# Patient Record
Sex: Female | Born: 1985 | Race: Black or African American | Hispanic: No | Marital: Single
Health system: Midwestern US, Community
[De-identification: ages and names within clinical notes are randomized; demographics above are authoritative.]

## PROBLEM LIST (undated history)

## (undated) DIAGNOSIS — L309 Dermatitis, unspecified: Secondary | ICD-10-CM

## (undated) DIAGNOSIS — K219 Gastro-esophageal reflux disease without esophagitis: Secondary | ICD-10-CM

## (undated) DIAGNOSIS — G43909 Migraine, unspecified, not intractable, without status migrainosus: Secondary | ICD-10-CM

## (undated) DIAGNOSIS — F411 Generalized anxiety disorder: Secondary | ICD-10-CM

## (undated) DIAGNOSIS — F419 Anxiety disorder, unspecified: Secondary | ICD-10-CM

## (undated) DIAGNOSIS — Z973 Presence of spectacles and contact lenses: Secondary | ICD-10-CM

## (undated) DIAGNOSIS — N941 Unspecified dyspareunia: Secondary | ICD-10-CM

## (undated) DIAGNOSIS — N83209 Unspecified ovarian cyst, unspecified side: Secondary | ICD-10-CM

## (undated) DIAGNOSIS — Z8619 Personal history of other infectious and parasitic diseases: Secondary | ICD-10-CM

## (undated) DIAGNOSIS — M1990 Unspecified osteoarthritis, unspecified site: Secondary | ICD-10-CM

## (undated) DIAGNOSIS — N930 Postcoital and contact bleeding: Secondary | ICD-10-CM

## (undated) HISTORY — DX: Dermatitis, unspecified: L30.9

## (undated) HISTORY — DX: Migraine, unspecified, not intractable, without status migrainosus: G43.909

## (undated) HISTORY — DX: Unspecified osteoarthritis, unspecified site: M19.90

## (undated) HISTORY — DX: Unspecified ovarian cyst, unspecified side: N83.209

## (undated) HISTORY — DX: Gastro-esophageal reflux disease without esophagitis: K21.9

## (undated) HISTORY — PX: HYSTEROSCOPY WITH D & C: SHX1775

## (undated) HISTORY — DX: Personal history of other infectious and parasitic diseases: Z86.19

## (undated) HISTORY — DX: Anxiety disorder, unspecified: F41.9

---
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
## 2006-06-14 HISTORY — PX: CERVICAL POLYPECTOMY: SHX88

## 2007-08-29 ENCOUNTER — Ambulatory Visit (HOSPITAL_COMMUNITY): Admission: RE | Admit: 2007-08-29 | Discharge: 2007-08-29 | Payer: Self-pay | Admitting: Obstetrics and Gynecology

## 2007-08-29 ENCOUNTER — Encounter (INDEPENDENT_AMBULATORY_CARE_PROVIDER_SITE_OTHER): Payer: Self-pay | Admitting: Obstetrics and Gynecology

## 2010-06-28 LAB — URINALYSIS, DIRECTED
Bilirubin, Urine: NEGATIVE
Blood, Urine: NEGATIVE
Glucose, UA: NEGATIVE
Ketones, Urine: NEGATIVE
Leukocyte Esterase, Urine: NEGATIVE
Nitrite, Urine: NEGATIVE
Protein, UA: NEGATIVE
Specific Gravity, UA: 1.02 (ref 1.015–1.025)
Urobilinogen, Urine: 1
pH, UA: 7

## 2010-06-28 LAB — WET PREP, GENITAL
TRICHOMONAS VAGINALIS SCREEN: NONE SEEN
Yeast, UA: NONE SEEN

## 2010-06-29 LAB — C. TRACHOMATIS / N. GONORRHOEAE, DNA
C. trachomatis DNA: NEGATIVE
GC DNA: NEGATIVE

## 2010-08-30 LAB — URINALYSIS, DIRECTED
Bilirubin, Urine: NEGATIVE
Blood, Urine: NEGATIVE
Glucose, UA: NEGATIVE
Nitrite, Urine: NEGATIVE
Specific Gravity, UA: 1.029 — ABNORMAL HIGH (ref 1.015–1.025)
Urobilinogen, Urine: 1
pH, UA: 6.5

## 2010-08-30 LAB — COMPREHENSIVE METABOLIC PANEL
ALT: 21 U/L (ref 0–63)
AST: 24 U/L (ref 0–35)
Albumin: 5 g/dL (ref 3.5–5.2)
Alk Phosphatase: 67 U/L (ref 40–135)
Anion Gap: 7 mEq/L (ref 7–13)
BUN: 13 mg/dL (ref 10–26)
CO2: 28 mEq/L (ref 22–32)
Calcium: 9.5 mg/dL (ref 8.5–10.2)
Chloride: 105 mEq/L (ref 99–111)
Creatinine: 0.94 mg/dL (ref 0.50–1.10)
GFR African American: 88.6
GFR Non-African American: 73.2
Globulin: 3.3 g/dL (ref 2.3–3.5)
Glucose: 72 mg/dL (ref 60–115)
Potassium: 4 mEq/L (ref 3.5–5.5)
Sodium: 140 mEq/L (ref 135–146)
Total Bilirubin: 0.8 mg/dL (ref 0.2–1.1)
Total Protein: 8.3 g/dL — ABNORMAL HIGH (ref 6.4–8.1)

## 2010-08-30 LAB — MICROSCOPIC URINALYSIS

## 2010-08-30 LAB — CBC WITH DIFFERENTIAL
Basophils %: 0 % (ref 0.0–2.0)
Basophils Absolute: 0 10*3/uL (ref 0.0–0.2)
Eosinophils %: 1.4 % (ref 0.0–4.0)
Eosinophils Absolute: 0.1 10*3/uL (ref 0.0–0.7)
Granulocyte Absolute Count: 5.7 10*3/uL (ref 1.4–6.5)
Granulocytes, Relative Percent: 82.8 % — ABNORMAL HIGH (ref 42.2–75.2)
HCT: 47.4 % — ABNORMAL HIGH (ref 37.0–47.0)
Hemoglobin: 15.9 g/dL (ref 12.0–16.0)
Lymphocytes %: 7.7 % — ABNORMAL LOW (ref 20.5–51.1)
Lymphocytes Absolute: 0.5 10*3/uL — ABNORMAL LOW (ref 1.0–4.8)
MCH: 32.9 pg — ABNORMAL HIGH (ref 27.0–31.0)
MCHC: 33.5 % (ref 33.0–37.0)
MCV: 98.4 fL (ref 82.0–100.0)
MPV: 10 fL (ref 7.4–10.4)
Monocytes %: 8.1 % (ref 2.0–11.0)
Monocytes Absolute: 0.6 10*3/uL (ref 0.2–0.8)
Platelets: 210 10*3/uL (ref 130–400)
RBC: 4.82 10*6/uL (ref 4.20–5.40)
RDW: 12.6 % (ref 11.5–14.5)
WBC: 6.9 10*3/uL (ref 4.8–10.8)

## 2010-08-30 LAB — LIPASE: Lipase: 23 U/L (ref 7–59)

## 2010-08-30 LAB — AMYLASE: Amylase: 66 U/L (ref 25–118)

## 2010-08-30 LAB — PREGNANCY, URINE: Pregnancy, Urine: NEGATIVE

## 2010-10-27 NOTE — Op Note (Signed)
Stacy Hawkins, Stacy Hawkins         ACCOUNT NO.:  [id number]   MEDICAL RECORD NO.:  [id number]          PATIENT TYPE:  AMB   LOCATION:  SDC                           FACILITY:  [facility]   PHYSICIAN:  Osborn Coho, M.D.   DATE OF BIRTH:  1985/11/22   DATE OF PROCEDURE:  08/29/2007  DATE OF DISCHARGE:                               OPERATIVE REPORT   PREOPERATIVE DIAGNOSIS:  Metrorrhagia.   POSTOPERATIVE DIAGNOSIS:  Metrorrhagia.   PROCEDURE:  1. Hysteroscopy.  2. D&C.   SURGEON:  Dr. Osborn Coho.   ANESTHESIA:  General via LMA.   SPECIMENS TO PATHOLOGY:  Endometrial curettings.   FLUIDS:  1200 mL   URINE OUTPUT:  Approximately 500 mL via straight cath prior to  procedure.   ESTIMATED BLOOD LOSS:  Minimal.   COMPLICATIONS:  None.   HYSTEROSCOPIC FLUID DEFICIT:  50 mL of sorbitol   PROCEDURE:  The patient was taken to the operating room after the risks,  benefits and alternatives were discussed with the patient.  The patient  verbalized understanding and consent signed and witnessed.  The patient  was placed under general per anesthesia and prepped and draped in the  normal sterile fashion in the dorsal lithotomy position.  A bivalve  speculum was placed in the patient's vagina and the anterior lip of the  cervix grasped with a single-tooth tenaculum.  A paracervical block was  administered using a total of 10 mL of 1% lidocaine.  The cervix was  dilated for passage of the hysteroscope and the uterus sounded to  approximately 8 cm.  The hysteroscope was introduced and no obvious  intracavitary lesions were noted.  Curettage was performed until a  gritty texture was noted.  The  hysteroscope was introduced once again and again no intracavitary  lesions noted.  All instruments were removed.  There was good hemostasis  at the tenaculum site.  The patient tolerated the procedure well.  The  count was correct.  The patient was awaiting return to the recovery room  in good  condition.      Osborn Coho, M.D.  Electronically Signed     AR/MEDQ  D:  08/29/2007  T:  08/29/2007  Job:  [id number]

## 2011-01-29 LAB — CBC WITH DIFFERENTIAL
Basophils %: 0.5 % (ref 0.0–2.0)
Basophils Absolute: 0 10*3/uL (ref 0.0–0.2)
Eosinophils %: 9.1 % — ABNORMAL HIGH (ref 0.0–4.0)
Eosinophils Absolute: 0.4 10*3/uL (ref 0.0–0.7)
Granulocyte Absolute Count: 1.9 10*3/uL (ref 1.4–6.5)
Granulocytes, Relative Percent: 46.4 % (ref 42.2–75.2)
Hematocrit: 38.3 % (ref 37.0–47.0)
Hemoglobin: 13.3 g/dL (ref 12.0–16.0)
Lymphocytes %: 32.1 % (ref 20.5–51.1)
Lymphocytes Absolute: 1.3 10*3/uL (ref 1.0–4.8)
MCH: 33.4 pg — ABNORMAL HIGH (ref 27.0–31.0)
MCHC: 34.8 % (ref 33.0–37.0)
MCV: 95.9 fL (ref 82.0–100.0)
MPV: 9.8 fL (ref 7.4–10.4)
Monocytes %: 11.9 % — ABNORMAL HIGH (ref 2.0–11.0)
Monocytes Absolute: 0.5 10*3/uL (ref 0.2–0.8)
Platelets: 193 10*3/uL (ref 130–400)
RBC: 3.99 10*6/uL — ABNORMAL LOW (ref 4.20–5.40)
RDW: 12.2 % (ref 11.5–14.5)
WBC: 4.1 10*3/uL — ABNORMAL LOW (ref 4.8–10.8)

## 2011-01-29 LAB — COMPREHENSIVE METABOLIC PANEL
ALT: 21 U/L (ref 0–63)
AST: 24 U/L (ref 0–35)
Albumin: 4.3 g/dL (ref 3.5–5.2)
Alk Phosphatase: 78 U/L (ref 40–135)
Anion Gap: 5 mEq/L — ABNORMAL LOW (ref 7–13)
BUN: 8 mg/dL — ABNORMAL LOW (ref 10–26)
CO2: 29 mEq/L (ref 22–32)
Calcium: 8.8 mg/dL (ref 8.5–10.2)
Chloride: 107 mEq/L (ref 99–111)
Creatinine: 0.78 mg/dL (ref 0.50–1.10)
GFR African American: 109.5
GFR Non-African American: 90.5
Globulin: 2.6 g/dL (ref 2.3–3.5)
Glucose: 85 mg/dL (ref 60–115)
Potassium: 4 mEq/L (ref 3.5–5.5)
Sodium: 141 mEq/L (ref 135–146)
Total Bilirubin: 0.5 mg/dL (ref 0.2–1.1)
Total Protein: 6.9 g/dL (ref 6.4–8.1)

## 2011-03-08 LAB — CBC
HCT: 42.2
Hemoglobin: 14.9
RDW: 12.3
WBC: 6.9

## 2012-05-13 LAB — CBC WITH DIFFERENTIAL
Basophils %: 0.7 %
Basophils Absolute: 0.1 10*3/uL (ref 0.0–0.2)
Eosinophils %: 3.9 %
Eosinophils Absolute: 0.3 10*3/uL (ref 0.0–0.7)
Hematocrit: 38.8 % (ref 37.0–47.0)
Hemoglobin: 13.4 g/dL (ref 12.0–16.0)
Lymphocytes %: 25.8 %
Lymphocytes Absolute: 2 10*3/uL (ref 1.0–4.8)
MCH: 33.4 pg — ABNORMAL HIGH (ref 27.0–31.3)
MCHC: 34.4 % (ref 33.0–37.0)
MCV: 97 fL (ref 82.0–100.0)
MPV: 10.2 fL (ref 7.4–10.4)
Monocytes %: 8.9 %
Monocytes Absolute: 0.7 10*3/uL (ref 0.2–0.8)
Neutrophils %: 60.7 %
Neutrophils Absolute: 4.6 10*3/uL (ref 1.4–6.5)
Platelets: 202 10*3/uL (ref 130–400)
RBC: 4 M/uL — ABNORMAL LOW (ref 4.20–5.40)
RDW: 12.6 % (ref 11.5–14.5)
WBC: 7.7 10*3/uL (ref 4.8–10.8)

## 2012-05-13 LAB — COMPREHENSIVE METABOLIC PANEL
ALT: 19 U/L (ref 0–63)
AST: 26 U/L (ref 0–35)
Albumin: 4.2 g/dL (ref 3.5–5.2)
Alkaline Phosphatase: 73 U/L (ref 40–135)
Anion Gap: 8 mEq/L (ref 7–13)
BUN: 16 mg/dL (ref 10–26)
CO2: 27 mEq/L (ref 22–32)
Calcium: 9.4 mg/dL (ref 8.5–10.2)
Chloride: 110 mEq/L (ref 99–111)
Creatinine: 0.88 mg/dL (ref 0.50–1.10)
GFR African American: >60 (ref >60)
GFR Non-African American: >60 (ref >60)
Globulin: 2.6 g/dL (ref 2.3–3.5)
Glucose: 71 mg/dL (ref 60–115)
Potassium: 4.7 mEq/L (ref 3.5–5.5)
Sodium: 145 mEq/L (ref 135–146)
Total Bilirubin: 0.4 mg/dL (ref 0.2–1.1)
Total Protein: 6.8 g/dL (ref 6.4–8.1)

## 2012-05-13 LAB — URINALYSIS, DIRECTED
Bilirubin Urine: NEGATIVE
Glucose, Ur: NEGATIVE mg/dL
Ketones, Urine: NEGATIVE mg/dL
Nitrite, Urine: NEGATIVE
Protein, UA: 30 mg/dL — AB
Specific Gravity, UA: 1.021 (ref 1.005–1.030)
Urobilinogen, Urine: 1 E.U./dL (ref <2.0)
pH, UA: 6 (ref 5.0–9.0)

## 2012-05-13 LAB — LIPASE: Lipase: 32 U/L (ref 7–59)

## 2012-05-13 LAB — URINALYSIS WITH MICROSCOPIC

## 2012-05-13 LAB — AMYLASE: Amylase: 161 U/L — ABNORMAL HIGH (ref 25–118)

## 2014-04-17 ENCOUNTER — Inpatient Hospital Stay: Admit: 2014-04-17 | Discharge: 2014-04-17 | Disposition: A | Discharge status: Home / Self Care

## 2014-04-17 LAB — URINALYSIS, DIRECTED
Bilirubin Urine: NEGATIVE
Glucose, Ur: NEGATIVE mg/dL
Nitrite, Urine: NEGATIVE
Protein, UA: 30 mg/dL — AB
Specific Gravity, UA: 1.029 (ref 1.005–1.030)
Urobilinogen, Urine: 1 E.U./dL (ref <2.0)
pH, UA: 6.5 (ref 5.0–9.0)

## 2014-04-17 LAB — URINALYSIS WITH MICROSCOPIC

## 2014-04-17 LAB — COMPREHENSIVE METABOLIC PANEL
ALT: 31 U/L (ref 0–33)
AST: 22 U/L (ref 0–35)
Albumin: 4.3 g/dL (ref 3.9–4.9)
Alkaline Phosphatase: 73 U/L (ref 40–130)
Anion Gap: 11 mEq/L (ref 7–13)
BUN: 11 mg/dL (ref 6–20)
CO2: 24 mEq/L (ref 22–29)
Calcium: 9 mg/dL (ref 8.6–10.2)
Chloride: 102 mEq/L (ref 98–107)
Creatinine: 0.69 mg/dL (ref 0.50–0.90)
GFR African American: >60 (ref >60)
GFR Non-African American: >60 (ref >60)
Globulin: 2.6 g/dL (ref 2.3–3.5)
Glucose: 81 mg/dL (ref 74–109)
Potassium: 3.7 mEq/L (ref 3.5–5.1)
Sodium: 137 mEq/L (ref 132–144)
Total Bilirubin: 0.4 mg/dL (ref 0.0–1.2)
Total Protein: 6.9 g/dL (ref 6.4–8.1)

## 2014-04-17 LAB — CBC WITH DIFFERENTIAL
Basophils %: 0.6 %
Basophils Absolute: 0 10*3/uL (ref 0.0–0.2)
Eosinophils %: 3.7 %
Eosinophils Absolute: 0.2 10*3/uL (ref 0.0–0.7)
Hematocrit: 35.8 % — ABNORMAL LOW (ref 37.0–47.0)
Hemoglobin: 11.9 g/dL — ABNORMAL LOW (ref 12.0–16.0)
Lymphocytes %: 35.4 %
Lymphocytes Absolute: 1.7 10*3/uL (ref 1.0–4.8)
MCH: 32.7 pg — ABNORMAL HIGH (ref 27.0–31.3)
MCHC: 33.3 % (ref 33.0–37.0)
MCV: 98 fL (ref 82.0–100.0)
Monocytes %: 12.2 %
Monocytes Absolute: 0.6 10*3/uL (ref 0.2–0.8)
Neutrophils %: 48.1 %
Neutrophils Absolute: 2.4 10*3/uL (ref 1.4–6.5)
Platelets: 198 10*3/uL (ref 130–400)
RBC: 3.65 M/uL — ABNORMAL LOW (ref 4.20–5.40)
RDW: 12.2 % (ref 11.5–14.5)
WBC: 4.9 10*3/uL (ref 4.8–10.8)

## 2014-04-17 LAB — PREGNANCY, URINE: HCG(Urine) Pregnancy Test: NEGATIVE

## 2014-04-17 LAB — LIPASE: Lipase: 19 U/L (ref 13–60)

## 2014-04-17 MED ORDER — CEFTRIAXONE SODIUM IN DEXTROSE 20 MG/ML IV SOLN
20 MG/ML | INTRAVENOUS | Status: AC
Start: 2014-04-17 — End: ?

## 2014-04-17 MED ORDER — KETOROLAC TROMETHAMINE 30 MG/ML IJ SOLN
30 MG/ML | INTRAMUSCULAR | Status: AC
Start: 2014-04-17 — End: ?

## 2014-04-17 MED ORDER — SODIUM CHLORIDE 0.9 % IV SOLN
0.9 % | INTRAVENOUS | Status: AC
Start: 2014-04-17 — End: ?

## 2014-04-17 MED FILL — SODIUM CHLORIDE 0.9 % IV SOLN: 0.9 % | INTRAVENOUS | Qty: 1000

## 2014-04-17 MED FILL — CEFTRIAXONE SODIUM IN DEXTROSE 20 MG/ML IV SOLN: 20 MG/ML | INTRAVENOUS | Qty: 50

## 2014-04-17 MED FILL — KETOROLAC TROMETHAMINE 30 MG/ML IJ SOLN: 30 MG/ML | INTRAMUSCULAR | Qty: 1

## 2015-05-17 ENCOUNTER — Emergency Department: Admit: 2015-05-17 | Discharge: 2015-05-17 | Payer: PRIVATE HEALTH INSURANCE | Primary: Internal Medicine

## 2015-05-17 ENCOUNTER — Inpatient Hospital Stay
Admit: 2015-05-17 | Discharge: 2015-05-17 | Disposition: A | Discharge status: Home / Self Care | Payer: PRIVATE HEALTH INSURANCE

## 2015-05-17 DIAGNOSIS — S0083XA Contusion of other part of head, initial encounter: Secondary | ICD-10-CM

## 2015-05-17 LAB — POCT URINE PREGNANCY: Preg Test, Ur: NEGATIVE

## 2015-05-17 MED ORDER — TRAMADOL HCL 50 MG PO TABS
50 MG | ORAL_TABLET | Freq: Four times a day (QID) | ORAL | 0 refills | Status: AC | PRN
Start: 2015-05-17 — End: ?

## 2015-05-17 MED ORDER — IBUPROFEN 800 MG PO TABS
800 MG | Freq: Once | ORAL | Status: AC
Start: 2015-05-17 — End: 2015-05-17
  Administered 2015-05-17: 08:00:00 800 mg via ORAL

## 2015-05-17 MED FILL — IBUPROFEN 800 MG PO TABS: 800 MG | ORAL | Qty: 1

## 2015-05-17 NOTE — ED Triage Notes (Signed)
Pt reports she fell down her outside steps around 0130 and hit her jaw on the cement step, she feels like the right side of her jaw might be slightly dislocated, states when she closes her mouth her teeth dont go together on the right side. She rates the pain 4/10 but said it is worse when she opens her mouth. She denies loc, no vomiting.

## 2015-05-17 NOTE — ED Provider Notes (Signed)
[facility] ED  eMERGENCY dEPARTMENT eNCOUnter      Pt Name: Leslie Vazquez  MRN: [id number]  Birthdate [birth year]  Date of evaluation: 05/17/2015  Provider: Merry Lofty, PA      HISTORY OF PRESENT ILLNESS    Leslie Vazquez is a 29 y.o. female presents to the emergency department with right jaw pain.  Patient tripped over her feet around 1:30 AM and fell down 2-3 cement steps hitting the right side of her jaw.  Patient denies loss of consciousness, denies head injuries, or injuries in another location.  When she bites down she feels the left side of her teeth touch, she does not feel her teeth on the right side touch however.  She denies visual disturbances, dizziness, lightheadedness, chest pain, shortness of breath, nausea, vomiting.  Patient has a right frontal headache and right cheek pain as well.     HPI    Nursing Notes were reviewed.    REVIEW OF SYSTEMS       Review of Systems   Constitutional: Negative for activity change, appetite change, chills and fever.   HENT: Negative for drooling, ear discharge, facial swelling, hearing loss, sore throat, trouble swallowing and voice change.         Right jaw pain     Eyes: Negative for redness.   Respiratory: Negative for cough and shortness of breath.    Cardiovascular: Negative for chest pain.   Gastrointestinal: Negative for abdominal pain, anal bleeding, blood in stool, diarrhea, nausea and vomiting.   Genitourinary: Negative for difficulty urinating.   Musculoskeletal: Negative for gait problem, neck pain and neck stiffness.   Skin: Negative for color change and rash.   Neurological: Positive for headaches. Negative for dizziness, syncope, facial asymmetry, speech difficulty, light-headedness and numbness.             PAST MEDICAL HISTORY     Past Medical History   Diagnosis Date   ??? Asthma          SURGICAL HISTORY     History reviewed. No pertinent past surgical history.      CURRENT MEDICATIONS       Previous Medications    No medications on  file       ALLERGIES     Review of patient's allergies indicates no known allergies.    FAMILY HISTORY     History reviewed. No pertinent family history.       SOCIAL HISTORY       Social History     Social History   ??? Marital status: Single     Spouse name: N/A   ??? Number of children: N/A   ??? Years of education: N/A     Social History Main Topics   ??? Smoking status: Never Smoker   ??? Smokeless tobacco: None   ??? Alcohol use Yes      Comment: occasionally   ??? Drug use: No   ??? Sexual activity: Not Asked     Other Topics Concern   ??? None     Social History Narrative   ??? None         PHYSICAL EXAM       ED Triage Vitals   BP Temp Temp Source Pulse Resp SpO2 Height Weight   05/17/15 0254 05/17/15 0254 05/17/15 0254 05/17/15 0254 05/17/15 0254 05/17/15 0254 05/17/15 0254 05/17/15 0254   117/75 98.6 ??F (37 ??C) Oral 100 16 99 % 5\' 5"  (1.651 m) 175  lb (79.4 kg)       Physical Exam   Constitutional: She is oriented to person, place, and time. She appears well-developed and well-nourished.   HENT:   Head: Normocephalic and atraumatic.       Right Ear: External ear normal.   Left Ear: External ear normal.   Mouth/Throat: Oropharynx is clear and moist.   No raccoon eyes, no battle sign. Minimal TTP in right side of jaw.  No obvious malocclusion.  Patient able to break tongue blades bilaterally with teeth   Eyes: Conjunctivae and EOM are normal. Pupils are equal, round, and reactive to light.   Neck: Normal range of motion. Neck supple. No tracheal deviation present.   Cardiovascular: Normal heart sounds and intact distal pulses.    Pulmonary/Chest: Effort normal and breath sounds normal. No stridor. No respiratory distress.   Abdominal: Soft. Bowel sounds are normal. She exhibits no distension and no mass. There is no tenderness. There is no rebound and no guarding.   Musculoskeletal: Normal range of motion.   Neurological: She is alert and oriented to person, place, and time. She has normal reflexes.   Skin: Skin is warm and  dry. No rash noted.   Psychiatric: She has a normal mood and affect. Her behavior is normal. Judgment and thought content normal.       DIAGNOSTIC RESULTS     EKG: All EKG's are interpreted by the Emergency Department Physician who either signs or Co-signs this chart in the absence of a cardiologist.        RADIOLOGY:   Non-plain film images such as CT, Ultrasound and MRI are read by the radiologist. Plain radiographic images are visualized and preliminarily interpreted by the emergency physician with the below findings:    Interpretation per the Radiologist below, if available at the time of this note:      CT FACIAL BONES WO CONTRAST    (Results Pending)   CT= no fracture        LABS:  Labs Reviewed   POCT URINE PREGNANCY - Normal       All other labs were within normal range or not returned as of this dictation.    EMERGENCY DEPARTMENT COURSE and DIFFERENTIAL DIAGNOSIS/MDM:   Vitals:    Vitals:    05/17/15 0254   BP: 117/75   Pulse: 100   Resp: 16   Temp: 98.6 ??F (37 ??C)   TempSrc: Oral   SpO2: 99%   Weight: 175 lb (79.4 kg)   Height:  (1.651 m)         MDM    Pt is laying comfortably in bed.  States Motrin did little for her pain.  She does not want anything stronger.  I informed her I we'll send her home with a couple tramadol for home and she can take them if she would like.  I also informed she can take Motrin and Tylenol for the pain.  I informed to ice the jaw when she gets home and for the first 2 days.  She is to return to the ER if anything gets worse. Standard anticipatory guidance given to patient upon discharge. Have given them a specific time frame in which to follow-up and who to follow-up with. I have also advised them that they should return to the emergency department if they get worse, or not getting better or develop any new or concerning symptoms. Patient demonstrates understanding.        CRITICAL CARE  TIME   Total Critical Care time was 0 minutes, excluding separately reportable  procedures.  There was a high probability of clinically significant/life threatening deterioration in the patient's condition which required my urgent intervention.         Procedures    FINAL IMPRESSION      1. Contusion of jaw, initial encounter          DISPOSITION/PLAN   DISPOSITION Decision to Discharge    PATIENT REFERRED TO:  Emogene MorganWilliam S Richardson, MD  [address]  [address]  [place] [id number]  [phone]    In 2 days  As needed      DISCHARGE MEDICATIONS:  New Prescriptions    TRAMADOL (ULTRAM) 50 MG TABLET    Take 1 tablet by mouth every 6 hours as needed for Pain     Attestation: The Prescription Monitoring Report for this patient was reviewed today. Cala Bradford(Markelle Najarian ChildressFinch, [place])    (Please note that portions of this note were completed with a voice recognition program.  Efforts were made to edit the dictations but occasionally words are mis-transcribed.)    Merry LoftyKIMBERLY FINCH, PA (electronically signed)  Emergency Physician Assistant         Merry LoftyKimberly Finch, [place]  05/17/15 [phone]

## 2016-07-21 ENCOUNTER — Emergency Department: Admit: 2016-07-22 | Payer: PRIVATE HEALTH INSURANCE | Primary: Internal Medicine

## 2016-07-21 DIAGNOSIS — S99921A Unspecified injury of right foot, initial encounter: Secondary | ICD-10-CM

## 2016-07-21 NOTE — ED Notes (Signed)
A Gosseaux NP at bedside     Kerby Less, RN  07/21/16 [phone]

## 2016-07-21 NOTE — ED Notes (Signed)
Pt refusing to wear post op shoe home, requests to wear normal tennis shoe. Pt given post op shoe to take home     [facility] [place] [place]  07/21/16 [phone]

## 2016-07-21 NOTE — ED Triage Notes (Signed)
Patient hit her right foot on a rocking chair today and is having pain.  Patient ambulated to triage. MSP's intact

## 2016-07-21 NOTE — ED Provider Notes (Signed)
[facility] [place] ED  eMERGENCY dEPARTMENT eNCOUnter      Pt Name: Leslie Vazquez  MRN: [id number]  Birthdate August 07, 1985  Date of evaluation: 07/21/2016  Provider: Durene Cal, NP     CHIEF COMPLAINT       Chief Complaint   Patient presents with   . Foot Injury     hit right foot on a rocking chair.  patient ambulated to triage       HISTORY OF PRESENT ILLNESS   (Location/Symptom, Timing/Onset, Context/Setting, Quality, Duration, Modifying Factors, Severity) Note limiting factors.   HPI    Leslie Vazquez is a 31 year old female patient per chart review with a history of asthma. She presents with complaints of right foot pain after hitting the top of the right foot twice on a rocking chair today. The patient states that the pain is constant and throbbing and it hurts more if the top of the foot is touched. She denies any associated symptoms. She denies any difficulty with ambulation.       Nursing Notes were reviewed.    REVIEW OF SYSTEMS    (2+ for level 4; 10+ for level 5)     Review of Systems   Constitutional: Negative for appetite change and fever.   HENT: Negative for drooling, ear pain, sore throat, trouble swallowing and voice change.    Respiratory: Negative for cough and shortness of breath.    Cardiovascular: Negative for chest pain.   Gastrointestinal: Negative for abdominal pain, constipation, diarrhea, nausea and vomiting.   Genitourinary: Negative for decreased urine volume and dysuria.   Musculoskeletal: Negative for arthralgias, back pain, gait problem and joint swelling.   Skin: Negative for color change.   Neurological: Negative for dizziness, weakness, light-headedness and headaches.   Psychiatric/Behavioral: Negative for agitation and behavioral problems.       Except as noted above the remainder of the review of systems was reviewed and negative.     PAST MEDICAL HISTORY     Past Medical History:   Diagnosis Date   . Asthma        SURGICAL HISTORY     No past surgical history on  file.    CURRENT MEDICATIONS       Discharge Medication List as of 07/21/2016 11:26 PM      CONTINUE these medications which have NOT CHANGED    Details   traMADol (ULTRAM) 50 MG tablet Take 1 tablet by mouth every 6 hours as needed for Pain, Disp-5 tablet, R-0             ALLERGIES     Review of patient's allergies indicates no known allergies.    FAMILY HISTORY     No family history on file.       SOCIAL HISTORY       Social History     Social History   . Marital status: Single     Spouse name: N/A   . Number of children: N/A   . Years of education: N/A     Social History Main Topics   . Smoking status: Never Smoker   . Smokeless tobacco: Not on file   . Alcohol use Yes      Comment: occasionally   . Drug use: No   . Sexual activity: Yes     Partners: Male     Other Topics Concern   . Not on file     Social History Narrative   . No  narrative on file       SCREENINGS           PHYSICAL EXAM    (up to 7 for level 4, 8 or more for level 5)     ED Triage Vitals   BP Temp Temp Source Pulse Resp SpO2 Height Weight   07/21/16 2217 07/21/16 2217 07/21/16 2217 07/21/16 2217 07/21/16 2325 07/21/16 2217 07/21/16 2217 07/21/16 2217   116/60 98.9 F (37.2 C) Oral 80 20 100 % 5\' 5"  (1.651 m) 160 lb (72.6 kg)       Physical Exam   Constitutional: She is oriented to person, place, and time. She appears well-developed and well-nourished. No distress.   HENT:   Head: Normocephalic and atraumatic.   Eyes: Conjunctivae are normal. Right eye exhibits no discharge. Left eye exhibits no discharge.   Neck: Normal range of motion. Neck supple.   Cardiovascular: Normal rate and regular rhythm.    Pulmonary/Chest: Effort normal and breath sounds normal.   Abdominal: Soft. Bowel sounds are normal.   Musculoskeletal: Normal range of motion.   Neurological: She is alert and oriented to person, place, and time.   Skin: Skin is warm and dry.        Psychiatric: She has a normal mood and affect.   Nursing note and vitals reviewed.      DIAGNOSTIC  RESULTS     EKG: All EKG's are interpreted by the Emergency Department Physician who either signs or Co-signs this chart in the absence of a cardiologist.        RADIOLOGY:   Non-plain film images such as CT, Ultrasound and MRI are read by the radiologist. Plain radiographic images are visualized and preliminarily interpreted by the emergency physician with the below findings:  Xray shows no acute fracture or dislocation.     Interpretation per the Radiologist below (if available at the time of note entry):    XR FOOT RIGHT (MIN 3 VIEWS)   Final Result   NO ACUTE FRACTURES          LABS:  Labs Reviewed - No data to display    All other labs were within normal range or not returned as of this dictation.    EMERGENCY DEPARTMENT COURSE and DIFFERENTIAL DIAGNOSIS/MDM:   Vitals:    Vitals:    07/21/16 2217 07/21/16 2325   BP: 116/60    Pulse: 80 82   Resp:  20   Temp: 98.9 F (37.2 C)    TempSrc: Oral    SpO2: 100% 99%   Weight: 160 lb (72.6 kg)    Height: 5\' 5"  (1.651 m)        MDM    Patient presents to the ER with complaints of foot pain that started today. Patient's xray is unremarkable. The patient was provided a post op shoe for comfort which she refused to wear and requested to wear her own tennis shoe. The patient will be discharged home with a prescription for motrin and has been provided with anticipatory guidance and instructed on when to return to the ER in what time frame. The patient verbalized understanding and has no further questions.       CRITICAL CARE TIME     Total Critical Care time (not applicable if blank)     Total minutes, excluding separately reportable procedures. There was a high probability of clinically significant/life threatening deterioration in the patient's condition which required my urgent intervention. This includes discussing the case with  consultants, reviewing laboratory studies and images independently, arranging disposition, and speaking with  patient/family    CONSULTS:  None    PROCEDURES:  Unless otherwise noted below, none     Procedures    FINAL IMPRESSION      1. Right foot pain    2. Injury of right foot, initial encounter          DISPOSITION/PLAN   DISPOSITION Decision To Discharge 07/21/2016 11:23:32 PM      PATIENT REFERRED TO:  Emogene Morgan, MD  [address]  [address]  [place] [address]  [phone]    In 1 day        DISCHARGE MEDICATIONS:  Discharge Medication List as of 07/21/2016 11:26 PM      START taking these medications    Details   ibuprofen (ADVIL;MOTRIN) 600 MG tablet Take 1 tablet by mouth every 6 hours as needed for Pain, Disp-20 tablet, R-0Print                (Please note that portions of this note were completed with a voice recognition program.  Efforts were made to edit the dictations but occasionally words and phrases are mis-transcribed.)    Danyela Posas Honor Loh, NP  (electronically signed)                 Durene Cal, NP  07/25/16 2308

## 2016-07-22 ENCOUNTER — Inpatient Hospital Stay
Admit: 2016-07-22 | Discharge: 2016-07-22 | Disposition: A | Discharge status: Home / Self Care | Payer: PRIVATE HEALTH INSURANCE

## 2016-07-22 MED ORDER — IBUPROFEN 600 MG PO TABS
600 MG | ORAL_TABLET | Freq: Four times a day (QID) | ORAL | 0 refills | Status: AC | PRN
Start: 2016-07-22 — End: ?

## 2017-06-22 DIAGNOSIS — H40013 Open angle with borderline findings, low risk, bilateral: Secondary | ICD-10-CM | POA: Diagnosis not present

## 2017-06-23 DIAGNOSIS — Z3041 Encounter for surveillance of contraceptive pills: Secondary | ICD-10-CM | POA: Diagnosis not present

## 2017-06-23 DIAGNOSIS — R21 Rash and other nonspecific skin eruption: Secondary | ICD-10-CM | POA: Diagnosis not present

## 2017-06-23 DIAGNOSIS — Z01419 Encounter for gynecological examination (general) (routine) without abnormal findings: Secondary | ICD-10-CM | POA: Diagnosis not present

## 2017-08-30 DIAGNOSIS — M25562 Pain in left knee: Secondary | ICD-10-CM | POA: Diagnosis not present

## 2017-09-27 DIAGNOSIS — M1712 Unilateral primary osteoarthritis, left knee: Secondary | ICD-10-CM | POA: Diagnosis not present

## 2018-03-01 DIAGNOSIS — N83209 Unspecified ovarian cyst, unspecified side: Secondary | ICD-10-CM | POA: Diagnosis not present

## 2018-03-01 DIAGNOSIS — N946 Dysmenorrhea, unspecified: Secondary | ICD-10-CM | POA: Diagnosis not present

## 2018-06-26 DIAGNOSIS — Z01419 Encounter for gynecological examination (general) (routine) without abnormal findings: Secondary | ICD-10-CM | POA: Diagnosis not present

## 2018-06-26 DIAGNOSIS — Z124 Encounter for screening for malignant neoplasm of cervix: Secondary | ICD-10-CM | POA: Diagnosis not present

## 2018-06-26 DIAGNOSIS — Z6841 Body Mass Index (BMI) 40.0 and over, adult: Secondary | ICD-10-CM | POA: Diagnosis not present

## 2018-08-01 ENCOUNTER — Ambulatory Visit (INDEPENDENT_AMBULATORY_CARE_PROVIDER_SITE_OTHER): Payer: 59 | Admitting: Family Medicine

## 2018-08-01 ENCOUNTER — Encounter: Payer: Self-pay | Admitting: Family Medicine

## 2018-08-01 VITALS — BP 120/60 | HR 62 | Temp 98.8°F | Resp 12 | Ht 65.0 in | Wt 245.4 lb

## 2018-08-01 DIAGNOSIS — Z0000 Encounter for general adult medical examination without abnormal findings: Secondary | ICD-10-CM

## 2018-08-01 DIAGNOSIS — Z130 Encounter for screening for diseases of the blood and blood-forming organs and certain disorders involving the immune mechanism: Secondary | ICD-10-CM

## 2018-08-01 DIAGNOSIS — F411 Generalized anxiety disorder: Secondary | ICD-10-CM

## 2018-08-01 DIAGNOSIS — R740 Nonspecific elevation of levels of transaminase and lactic acid dehydrogenase [LDH]: Secondary | ICD-10-CM | POA: Diagnosis not present

## 2018-08-01 DIAGNOSIS — Z13228 Encounter for screening for other metabolic disorders: Secondary | ICD-10-CM | POA: Diagnosis not present

## 2018-08-01 DIAGNOSIS — Z1329 Encounter for screening for other suspected endocrine disorder: Secondary | ICD-10-CM | POA: Diagnosis not present

## 2018-08-01 DIAGNOSIS — Z23 Encounter for immunization: Secondary | ICD-10-CM

## 2018-08-01 DIAGNOSIS — G43009 Migraine without aura, not intractable, without status migrainosus: Secondary | ICD-10-CM | POA: Diagnosis not present

## 2018-08-01 DIAGNOSIS — R7401 Elevation of levels of liver transaminase levels: Secondary | ICD-10-CM

## 2018-08-01 DIAGNOSIS — Z13220 Encounter for screening for lipoid disorders: Secondary | ICD-10-CM | POA: Diagnosis not present

## 2018-08-01 MED ORDER — SUMATRIPTAN SUCCINATE 50 MG PO TABS: 50.0000 mg | ORAL_TABLET | Freq: Every day | ORAL | 1 refills | Status: DC | PRN

## 2018-08-01 NOTE — Assessment & Plan Note (Signed)
Stable overall. She agrees with trying Imitrex, some side effect discussed. Continue OTC Excedrin migraines if needed. Instructed about warning signs. Follow-up annually.

## 2018-08-01 NOTE — Patient Instructions (Addendum)
A few things to remember from today's visit:   Routine general medical examination at a health care facility  Elevated AST (SGOT) - Plan: Comprehensive metabolic panel, TSH  Screening for lipoid disorders - Plan: Lipid panel  Screening for endocrine, metabolic and immunity disorder - Plan: Comprehensive metabolic panel  Today you have you routine preventive visit.  At least 150 minutes of moderate exercise per week, daily brisk walking for 15-30 min is a good exercise option. Healthy diet low in saturated (animal) fats and sweets and consisting of fresh fruits and vegetables, lean meats such as fish and white chicken and whole grains.  These are some of recommendations for screening depending of age and risk factors:   - Vaccines:  Tdap vaccine every 10 years.  Given today.  Shingles vaccine recommended at age 29, could be given after 32 years of age but not sure about insurance coverage.   Pneumonia vaccines:  Prevnar 13 at 65 and Pneumovax at 79. Sometimes Pneumovax is giving earlier if history of smoking, lung disease,diabetes,kidney disease among some.    Screening for diabetes at age 45 and every 3 years.  Cervical cancer prevention:  Pap smear starts at 32 years of age and continues periodically until 32 years old in low risk women. Pap smear every 3 years between 52 and 25 years old. Pap smear every 3-5 years between women 78 and older if pap smear negative and HPV screening negative.   -Breast cancer: Mammogram: There is disagreement between experts about when to start screening in low risk asymptomatic female but recent recommendations are to start screening at 56 and not later than 32 years old , every 1-2 years and after 32 yo q 2 years. Screening is recommended until 32 years old but some women can continue screening depending of healthy issues.   Colon cancer screening: starts at 32 years old until 32 years old.  Cholesterol disorder screening at age 89 and  every 3 years.  Also recommended:  1. Dental visit- Brush and floss your teeth twice daily; visit your dentist twice a year. 2. Eye doctor- Get an eye exam at least every 2 years. 3. Helmet use- Always wear a helmet when riding a bicycle, motorcycle, rollerblading or skateboarding. 4. Safe sex- If you may be exposed to sexually transmitted infections, use a condom. 5. Seat belts- Seat belts can save your live; always wear one. 6. Smoke/Carbon Monoxide detectors- These detectors need to be installed on the appropriate level of your home. Replace batteries at least once a year. 7. Skin cancer- When out in the sun please cover up and use sunscreen 15 SPF or higher. 8. Violence- If anyone is threatening or hurting you, please tell your healthcare provider.  9. Drink alcohol in moderation- Limit alcohol intake to one drink or less per day. Never drink and drive.  Please be sure medication list is accurate. If a new problem present, please set up appointment sooner than planned today.

## 2018-08-01 NOTE — Progress Notes (Signed)
HPI:   Stacy Hawkins is a 33 y.o. female, who is here today to establish care and due for CPE.   Former PCP: N/A Last preventive routine visit: Over a year ago.  She sees her gynecologist annually, last visit in 06/2018.  Chronic medical problems: Knee OA (has seen ortho,left knee intraarticular injection), eczema,GERD, obesity, depression/anxiety, remote history of elevated AST, and migraines among some.   She lives with her husband.  Anxiety and depression, she follows with psychiatrist every 3 months. Currently she is on sertraline 100 mg daily. She has not psychotherapy in a while.  Migraine: Episodes are not as bad as they used to be 5 years ago. Temporal headache, most of the time left side, no visual aura. Mild nausea, photophobia. No vomiting or focal deficit.  She used to follow-up with neurologist, last appointment was about 3 years ago. She is taking OTC Excedrin migraines. He seems to be exacerbated by menstrual periods. 1-2 times per month.   She is exercising regularly, she is walking for 30 to 60 minutes about 5 times per week. In 06/2018 she started with weight watchers, she has lost 16 pounds in 6 weeks.     Component Value Ref Range Performed At [facility]  ALT 22 17 - 65 U/L [facility]   Sodium 137 134 - 145 mmol/L [facility]   Total Protein 7.3 6.4 - 8.2 g/dL [facility]   BUN 7 7 - 18 mg/dL [facility]   Glucose 91 65 - 99 mg/dL [facility]   Alkaline Phosphatase 69Comment: Adult reference range for this assay has been updated on 10/26/12 due to a reagent formulation change. 45 - 117 U/L [facility]   AST 39 (H)Comment: SPECIMEN MODERATELY HEMOLYZED 3 - 37 U/L [facility]   Potassium 4.2Comment: SPECIMEN MODERATELY HEMOLYZED 3.5 - 5.1 mmol/L [facility]   GFR MDRD Non Af Amer >60 Comment:  eGFR calculated by [employer].The eGFR is calculated using the four parameter MDRD equation for IDMS-traceable creatinine.  eGFR < 60 indicates  chronic kidney disease, eGFR < 15 indicates kidney failure.   [facility]   Calcium 8.3 (L) 8.5 - 10.1 mg/dL [facility]   Creatinine 0.32 (L)Comment: * This creatinine method is traceable to a GC-IDMS method and NIST standard reference material. 0.60 - 1.10 mg/dL [facility]   Total Bilirubin 0.6 0.2 - 1.0 mg/dL [facility]   Chloride 104 98 - 107 mmol/L [facility]   Albumin 3.8 3.4 - 5.0 g/dL [facility]   GFR MDRD Af Amer >60Comment: eGFR calculated by [employer].  [facility]   CO2 27.0 21.0 - 32.0 mmol/L [facility]    Corrected Ca++ 8.5.  Concerns today: She needs a CPE.   Review of Systems  Constitutional: Negative for appetite change, fatigue and fever.  HENT: Negative for dental problem, hearing loss, mouth sores, sore throat, trouble swallowing and voice change.   Eyes: Negative for redness and visual disturbance.  Respiratory: Negative for cough, shortness of breath and wheezing.   Cardiovascular: Negative for chest pain and leg swelling.  Gastrointestinal: Negative for abdominal pain, nausea and vomiting.       No changes in bowel habits.  Endocrine: Negative for cold intolerance, heat intolerance, polydipsia, polyphagia and polyuria.  Genitourinary: Negative for decreased urine volume, dysuria, hematuria, vaginal bleeding and vaginal discharge.  Musculoskeletal: Positive for arthralgias. Negative for gait problem and myalgias.  Skin: Negative for color change and rash.  Allergic/Immunologic: Negative for environmental allergies.  Neurological: Negative for syncope, weakness and headaches.  Hematological: Negative for adenopathy. Does not bruise/bleed easily.  Psychiatric/Behavioral: Positive for sleep disturbance. Negative for confusion. The patient is nervous/anxious.   All other systems reviewed and are negative.     Current Outpatient Medications on File Prior to Visit  Medication Sig Dispense Refill  . ibuprofen (ADVIL) 200 MG tablet Take 200 mg by mouth every 6 (six) hours as  needed.    . Melatonin 5 MG TABS Take 5 mg by mouth at bedtime.    . Multiple Vitamins-Minerals (QC WOMENS DAILY MULTIVITAMIN PO) Women's Daily Multivitamin    . sertraline (ZOLOFT) 100 MG tablet sertraline 100 mg tablet  TAKE 1 TABLET BY MOUTH EVERY DAY IN THE MORNING    . SPRINTEC 28 0.25-35 MG-MCG tablet Take 1 tablet by mouth daily.     No current facility-administered medications on file prior to visit.      Past Medical History:  Diagnosis Date  . Anxiety   . Arthritis   . Eczema   . GERD (gastroesophageal reflux disease)   . History of chicken pox   . Migraines   . Ovarian cyst    Allergies  Allergen Reactions  . Other     Family History  Problem Relation Age of Onset  . Arthritis Mother   . Asthma Mother   . Depression Mother   . Miscarriages / [place] Mother   . Alcohol abuse Father   . Depression Father   . Diabetes Father   . Drug abuse Father   . Hyperlipidemia Father   . Hypertension Father   . Mental illness Father   . Arthritis Father   . Asthma Brother   . Depression Brother   . Drug abuse Brother   . Learning disabilities Brother   . Mental illness Brother   . Arthritis Maternal Grandmother   . Depression Maternal Grandmother   . Macular degeneration Maternal Grandmother   . Hearing loss Maternal Grandfather   . Heart disease Maternal Grandfather   . Heart attack Maternal Grandfather   . Arthritis Paternal Grandmother   . Heart disease Paternal Grandmother   . Dementia Paternal Grandmother   . Heart disease Paternal Grandfather   . Hyperlipidemia Paternal Grandfather   . Hypertension Paternal Grandfather   . Depression Paternal Grandfather   . Diabetes Paternal Grandfather     Social History   Socioeconomic History  . Marital status: Married    Spouse name: Not on file  . Number of children: Not on file  . Years of education: Not on file  . Highest education level: Not on file  Occupational History  . Not on file  Social  Needs  . Financial resource strain: Not on file  . Food insecurity:    Worry: Not on file    Inability: Not on file  . Transportation needs:    Medical: Not on file    Non-medical: Not on file  Tobacco Use  . Smoking status: Never Smoker  . Smokeless tobacco: Never Used  Substance and Sexual Activity  . Alcohol use: Yes  . Drug use: Never  . Sexual activity: Yes    Partners: Male    Birth control/protection: OCP  Lifestyle  . Physical activity:    Days per week: Not on file    Minutes per session: Not on file  . Stress: Not on file  Relationships  . Social connections:    Talks on  phone: Not on file    Gets together: Not on file    Attends religious service: Not on file    Active member of club or organization: Not on file    Attends meetings of clubs or organizations: Not on file    Relationship status: Not on file  Other Topics Concern  . Not on file  Social History Narrative  . Not on file    Vitals:   08/01/18 1404  BP: 120/60  Pulse: 62  Resp: 12  Temp: 98.8 F (37.1 C)  SpO2: 98%    Body mass index is 40.83 kg/m.  Physical Exam  Nursing note and vitals reviewed. Constitutional: She is oriented to person, place, and time. She appears well-developed. No distress.  HENT:  Head: Normocephalic and atraumatic.  Right Ear: Hearing, tympanic membrane, external ear and ear canal normal.  Left Ear: Hearing, tympanic membrane, external ear and ear canal normal.  Mouth/Throat: Uvula is midline, oropharynx is clear and moist and mucous membranes are normal.  Eyes: Pupils are equal, round, and reactive to light. Conjunctivae and EOM are normal.  Neck: No tracheal deviation present. No thyroid mass and no thyromegaly (palpable) present.  Cardiovascular: Normal rate and regular rhythm.  No murmur heard. Pulses:      Dorsalis pedis pulses are 2+ on the right side and 2+ on the left side.  Respiratory: Effort normal and breath sounds normal. No respiratory distress.   GI: Soft. She exhibits no mass. There is no hepatomegaly. There is no abdominal tenderness.  Genitourinary:    Genitourinary Comments: Deferred to gyn.   Musculoskeletal:        General: No edema.     Comments: No major deformity or signs of synovitis appreciated.  Lymphadenopathy:    She has no cervical adenopathy.       Right: No supraclavicular adenopathy present.       Left: No supraclavicular adenopathy present.  Neurological: She is alert and oriented to person, place, and time. She has normal strength. No cranial nerve deficit. Coordination and gait normal.  Reflex Scores:      Bicep reflexes are 2+ on the right side and 2+ on the left side.      Patellar reflexes are 2+ on the right side and 2+ on the left side. Skin: Skin is warm. No rash noted. No erythema.  Psychiatric: Her mood appears anxious.  Well groomed, good eye contact.    ASSESSMENT AND PLAN:   Ms. Brenlee was seen today for establish care and annual exam.  Diagnoses and all orders for this visit:  Lab Results  Component Value Date   ALT 24 08/02/2018   AST 19 08/02/2018   ALKPHOS 49 08/02/2018   BILITOT 0.4 08/02/2018   Lab Results  Component Value Date   CREATININE 0.78 08/02/2018   BUN 9 08/02/2018   NA 139 08/02/2018   K 4.3 08/02/2018   CL 102 08/02/2018   CO2 27 08/02/2018  ' Lab Results  Component Value Date   TSH 1.98 08/02/2018   Lab Results  Component Value Date   CHOL 144 08/02/2018   HDL 35.40 (L) 08/02/2018   LDLCALC 88 08/02/2018   TRIG 101.0 08/02/2018   CHOLHDL 4 08/02/2018    Routine general medical examination at a health care facility We discussed the importance of regular physical activity and healthy diet for prevention of chronic illness and/or complications. Preventive guidelines reviewed. Vaccination updated. She will continue following with her gyn for  her female preventive care.  Next CPE in a year.  Elevated AST (SGOT) No Hx of high alcohol  intake. Further recommendations will be given according to LFT's.  -     Comprehensive metabolic panel; Future -     TSH; Future  Screening for lipoid disorders -     Lipid panel; Future  Screening for endocrine, metabolic and immunity disorder -     Comprehensive metabolic panel; Future  Need for Tdap vaccination -     Tdap vaccine greater than or equal to 7yo IM  Need for immunization against influenza -     Flu Vaccine QUAD 36+ mos IM  Migraine without aura and without status migrainosus, not intractable Stable overall. She agrees with trying Imitrex, some side effect discussed. Continue OTC Excedrin migraines if needed. Instructed about warning signs. Follow-up annually.  Obesity, morbid, BMI 40.0-49.9 ([place]) Continue regular physical activity and a healthful diet (weight watchers). We discussed adverse effects of obesity as well as benefits of weight loss.  GAD (generalized anxiety disorder) Currently following with psychiatrist. No changes in current management.    Return in 1 year (on 08/02/2019) for cpe.    Chen Saadeh G. Martinique, MD  [facility]. [place] office.

## 2018-08-01 NOTE — Assessment & Plan Note (Signed)
Currently following with psychiatrist. No changes in current management.

## 2018-08-01 NOTE — Assessment & Plan Note (Signed)
Continue regular physical activity and a healthful diet (weight watchers). We discussed adverse effects of obesity as well as benefits of weight loss.

## 2018-08-02 ENCOUNTER — Encounter: Payer: Self-pay | Admitting: Family Medicine

## 2018-08-02 LAB — TSH: TSH: 1.98 u[IU]/mL (ref 0.35–4.50)

## 2018-08-02 LAB — LIPID PANEL
Cholesterol: 144 mg/dL (ref 0–200)
HDL: 35.4 mg/dL — ABNORMAL LOW (ref >39.00)
LDL Cholesterol: 88 mg/dL (ref 0–99)
NonHDL: 108.19
Total CHOL/HDL Ratio: 4
Triglycerides: 101 mg/dL (ref 0.0–149.0)
VLDL: 20.2 mg/dL (ref 0.0–40.0)

## 2018-08-02 LAB — COMPREHENSIVE METABOLIC PANEL
ALBUMIN: 4.2 g/dL (ref 3.5–5.2)
ALT: 24 U/L (ref 0–35)
AST: 19 U/L (ref 0–37)
Alkaline Phosphatase: 49 U/L (ref 39–117)
BUN: 9 mg/dL (ref 6–23)
CO2: 27 mEq/L (ref 19–32)
Calcium: 9.4 mg/dL (ref 8.4–10.5)
Chloride: 102 mEq/L (ref 96–112)
Creatinine, Ser: 0.78 mg/dL (ref 0.40–1.20)
GFR: 85.69 mL/min (ref >60.00)
Glucose, Bld: 77 mg/dL (ref 70–99)
Potassium: 4.3 mEq/L (ref 3.5–5.1)
Sodium: 139 mEq/L (ref 135–145)
Total Bilirubin: 0.4 mg/dL (ref 0.2–1.2)
Total Protein: 6.4 g/dL (ref 6.0–8.3)

## 2018-09-12 DIAGNOSIS — Z6841 Body Mass Index (BMI) 40.0 and over, adult: Secondary | ICD-10-CM | POA: Diagnosis not present

## 2018-09-12 DIAGNOSIS — N921 Excessive and frequent menstruation with irregular cycle: Secondary | ICD-10-CM | POA: Diagnosis not present

## 2018-09-12 DIAGNOSIS — Z3009 Encounter for other general counseling and advice on contraception: Secondary | ICD-10-CM | POA: Diagnosis not present

## 2018-10-14 ENCOUNTER — Encounter: Payer: Self-pay | Admitting: Family Medicine

## 2018-10-16 ENCOUNTER — Ambulatory Visit (INDEPENDENT_AMBULATORY_CARE_PROVIDER_SITE_OTHER): Payer: 59 | Admitting: Family Medicine

## 2018-10-16 ENCOUNTER — Encounter: Payer: Self-pay | Admitting: Family Medicine

## 2018-10-16 ENCOUNTER — Other Ambulatory Visit: Payer: Self-pay

## 2018-10-16 DIAGNOSIS — M79672 Pain in left foot: Secondary | ICD-10-CM | POA: Diagnosis not present

## 2018-10-16 NOTE — Progress Notes (Signed)
Virtual Visit via Video Note   I connected with Stacy Hawkins on 10/16/18 at  3:00 PM EDT by a video enabled telemedicine application and verified that I am speaking with the correct person using two identifiers.  Location patient: home Location provider:home office Persons participating in the virtual visit: patient, provider  I discussed the limitations of evaluation and management by telemedicine and the availability of in person appointments.She expressed understanding and agreed to proceed.   HPI: Stacy Hawkins is a 33 year old female who is complaining about 5 days of left foot pain,lateral aspect. Gradual onset. She has not noted edema,, erythema, or deformity on affected area. Pain is constant, exacerbated by walking, aching/burning pain, 7/10. Alleviated by rest, 4/10. She denies associated fever, chills, numbness, tingling, or limitation of range of motion. She is reporting this problem as new.  No history of trauma or unusual physical activity. She mentions that she is working about 30 to 60 minutes daily.  Problem is slowly getting worse. She has taking OTC ibuprofen 400 mg, which helps with pain.  ROS: See pertinent positives and negatives per HPI.  Past Medical History:  Diagnosis Date  . Anxiety   . Arthritis   . Eczema   . GERD (gastroesophageal reflux disease)   . History of chicken pox   . Migraines   . Ovarian cyst     Past Surgical History:  Procedure Laterality Date  . CERVICAL POLYPECTOMY  2008   d and c for polyps  . deviated septum  2006  . WISDOM TOOTH EXTRACTION  2006    Family History  Problem Relation Age of Onset  . Arthritis Mother   . Asthma Mother   . Depression Mother   . Miscarriages / [place] Mother   . Alcohol abuse Father   . Depression Father   . Diabetes Father   . Drug abuse Father   . Hyperlipidemia Father   . Hypertension Father   . Mental illness Father   . Arthritis Father   . Asthma Brother   . Depression Brother    . Drug abuse Brother   . Learning disabilities Brother   . Mental illness Brother   . Arthritis Maternal Grandmother   . Depression Maternal Grandmother   . Macular degeneration Maternal Grandmother   . Hearing loss Maternal Grandfather   . Heart disease Maternal Grandfather   . Heart attack Maternal Grandfather   . Arthritis Paternal Grandmother   . Heart disease Paternal Grandmother   . Dementia Paternal Grandmother   . Heart disease Paternal Grandfather   . Hyperlipidemia Paternal Grandfather   . Hypertension Paternal Grandfather   . Depression Paternal Grandfather   . Diabetes Paternal Grandfather     Social History   Socioeconomic History  . Marital status: Married    Spouse name: Not on file  . Number of children: Not on file  . Years of education: Not on file  . Highest education level: Not on file  Occupational History  . Not on file  Social Needs  . Financial resource strain: Not on file  . Food insecurity:    Worry: Not on file    Inability: Not on file  . Transportation needs:    Medical: Not on file    Non-medical: Not on file  Tobacco Use  . Smoking status: Never Smoker  . Smokeless tobacco: Never Used  Substance and Sexual Activity  . Alcohol use: Yes  . Drug use: Never  . Sexual activity: Yes  Partners: Male    Birth control/protection: OCP  Lifestyle  . Physical activity:    Days per week: Not on file    Minutes per session: Not on file  . Stress: Not on file  Relationships  . Social connections:    Talks on phone: Not on file    Gets together: Not on file    Attends religious service: Not on file    Active member of club or organization: Not on file    Attends meetings of clubs or organizations: Not on file    Relationship status: Not on file  . Intimate partner violence:    Fear of current or ex partner: Not on file    Emotionally abused: Not on file    Physically abused: Not on file    Forced sexual activity: Not on file  Other  Topics Concern  . Not on file  Social History Narrative  . Not on file      Current Outpatient Medications:  .  ibuprofen (ADVIL) 200 MG tablet, Take 200 mg by mouth every 6 (six) hours as needed., Disp: , Rfl:  .  Melatonin 5 MG TABS, Take 5 mg by mouth at bedtime., Disp: , Rfl:  .  Multiple Vitamins-Minerals (QC WOMENS DAILY MULTIVITAMIN PO), Women's Daily Multivitamin, Disp: , Rfl:  .  sertraline (ZOLOFT) 100 MG tablet, sertraline 100 mg tablet  TAKE 1 TABLET BY MOUTH EVERY DAY IN THE MORNING, Disp: , Rfl:  .  SPRINTEC 28 0.25-35 MG-MCG tablet, Take 1 tablet by mouth daily., Disp: , Rfl:  .  SUMAtriptan (IMITREX) 50 MG tablet, Take 1 tablet (50 mg total) by mouth daily as needed for migraine. May repeat in 2 hours if headache persists or recurs., Disp: 10 tablet, Rfl: 1  EXAM:  VITALS per patient if applicable:N/A  GENERAL: alert, oriented, appears well and in no acute distress  HEENT: atraumatic, conjunttiva clear.  LUNGS: on inspection no signs of respiratory distress, breathing rate appears normal, no obvious gross SOB, gasping or wheezing  CV: no obvious cyanosis  Stacy: moves all visible extremities without noticeable abnormality.  Left foot: No limitation of ankle ROM but it elicits pain on mid aspect of 5th metatarsus.  I do not appreciate edema, ecchymosis, deformity or erythema.  Toes movement does not cause pain.  PSYCH/NEURO: pleasant and cooperative, no obvious depression or anxiety, speech and thought processing grossly intact  ASSESSMENT AND PLAN:  Discussed the following assessment and plan:  Acute foot pain, left - Plan: DG Foot Complete Left  We discussed possible etiologies, including possibility of stress fracture vs tenosynovitis. Recommend avoiding identified trigger factor like walking for now. Local ice. Continue OTC ibuprofen but increase dose from 400 mg to 800 mg 3 times daily with food for 5 to 7 days.  We discussed side effects of NSAIDs. OTC  lidocaine patch placed on affected area may also help.  Foot x-ray ordered, further recommendation will be given according to results.  If problem is persistent, recommend arranging appointment with podiatrist.    I discussed the assessment and treatment plan with the patient.  She was provided an opportunity to ask questions and all were answered.  She agreed with the plan and demonstrated an understanding of the instructions.   Return if symptoms worsen or fail to improve.    Callyn Severtson Martinique, MD

## 2018-10-17 ENCOUNTER — Ambulatory Visit (INDEPENDENT_AMBULATORY_CARE_PROVIDER_SITE_OTHER): Payer: 59

## 2018-10-17 DIAGNOSIS — M79672 Pain in left foot: Secondary | ICD-10-CM

## 2018-10-18 ENCOUNTER — Encounter: Payer: Self-pay | Admitting: Family Medicine

## 2018-10-18 NOTE — Telephone Encounter (Signed)
Problem was already addressed during virtual visit. Foot X ray ordered and NSAID's recommended. Podiatrist evaluation if pain is persistent.  Eather Chaires Martinique, MD

## 2018-10-22 ENCOUNTER — Encounter: Payer: Self-pay | Admitting: Family Medicine

## 2018-10-26 ENCOUNTER — Encounter: Payer: Self-pay | Admitting: Family Medicine

## 2018-10-27 DIAGNOSIS — E559 Vitamin D deficiency, unspecified: Secondary | ICD-10-CM | POA: Diagnosis not present

## 2018-10-27 DIAGNOSIS — M84375A Stress fracture, left foot, initial encounter for fracture: Secondary | ICD-10-CM | POA: Diagnosis not present

## 2018-11-02 DIAGNOSIS — M84375D Stress fracture, left foot, subsequent encounter for fracture with routine healing: Secondary | ICD-10-CM | POA: Diagnosis not present

## 2018-11-02 DIAGNOSIS — E559 Vitamin D deficiency, unspecified: Secondary | ICD-10-CM | POA: Diagnosis not present

## 2019-02-15 ENCOUNTER — Other Ambulatory Visit: Payer: Self-pay | Admitting: Family Medicine

## 2019-02-15 DIAGNOSIS — G43009 Migraine without aura, not intractable, without status migrainosus: Secondary | ICD-10-CM

## 2019-02-20 ENCOUNTER — Encounter: Payer: Self-pay | Admitting: Family Medicine

## 2019-06-12 ENCOUNTER — Ambulatory Visit: Payer: Self-pay | Admitting: *Deleted

## 2019-06-12 ENCOUNTER — Telehealth: Payer: 59 | Admitting: Physician Assistant

## 2019-06-12 DIAGNOSIS — Z20822 Contact with and (suspected) exposure to covid-19: Secondary | ICD-10-CM

## 2019-06-12 NOTE — Telephone Encounter (Signed)
Contacted pt due to her concerns about being exposed to [place] on 06/10/2019; symptoms reviewed with pt; also pt advised that it can take up to 14 days to evolve; she was advised to monitor for symptoms and quarantine; guidelines given: . remain in self-quarantine until they meet the "Non-Test Criteria for Ending Self-Isolation". Non-Test Criteria for Ending Self-Isolation All persons with fever and respiratory symptoms should isolate themselves until ALL conditions listed below are met: - at least 10 days since symptoms onset - AND 3 consecutive days fever free without antipyretics (acetaminophen [Tylenol] or ibuprofen [Advil]) AND improvement in respiratory symptoms; the pt was informed there are community sites that do not require an MD's order, but due require an appt, but she should wait 3-5 days from exposure to be tested; she verbalized understanding, and will schedule appt online to discuss this with her PCP; she sees Dr Martinique, Aviva Kluver; will route to office for notiification.  Reason for Disposition . General information question, no triage required and triager able to answer question  Answer Assessment - Initial Assessment Questions 1. REASON FOR CALL or QUESTION: "What is your reason for calling today?" or "How can I best help you?" or "What question do you have that I can help answer?"     COVID testing  Protocols used: [place]

## 2019-06-12 NOTE — Telephone Encounter (Signed)
FYI.  Pt did e-visit through [employer]. Has testing appt for 12/31.

## 2019-06-12 NOTE — Telephone Encounter (Signed)
Message Routed to PCP CMA 

## 2019-06-12 NOTE — Progress Notes (Signed)
E-Visit for Corona Virus Screening    You can certainly seek testing for COVID after your exposure. Alternatively, you can quarantine at home for 10 days after your exposure, and seek testing if you do develop any symptoms.    Many health care providers can now test patients at their office but not all are.  [place] has multiple testing sites. For information on our [place] testing locations and hours go to [email or web address]  We are enrolling you in our [place] for [place] . Daily you will receive a questionnaire within the [place] website. Our COVID 19 response team will be monitoring your responses daily.  Testing Information: The COVID-19 Community Testing sites will begin testing BY APPOINTMENT ONLY.  You can schedule online at [email or web address]  If you do not have access to a smart phone or computer you may call [phone] for an appointment.  Testing Locations: Appointment schedule is 8 am to 3:30 pm at all sites  [facility] indoors at [address], [place] [address] [facility]  indoors at [place]. [address], [place], [place] [address] [place] indoors at [address], [place] [address]  Additional testing sites in the Community:  . For [facility] Testing sites in [facility]  FaceUpdate.uy  . For Pop-up testing sites in [place]  BowlDirectory.co.uk  . For Testing sites with regular hours [email or web address].org/[email or web address]  . For [place] [email or web address]  . For [facility] BasicJet.[email or web address]  . For [facility] testing in [place] and [employer]  BasicJet.[email or web address]  . For [facility] testing in [facility]   https://lhi.care/covidtesting  For  more information about community testing call [phone]   We are enrolling you in our [place] for [place] . Daily you will receive a questionnaire within the [place] website. Our COVID 19 response team will be monitoring your responses daily.  Please quarantine yourself while awaiting your test results. If you develop fever/cough/breathlessness, please stay home for 10 days with improving symptoms and until you have had 24 hours of no fever (without taking a fever reducer).  You should wear a mask or cloth face covering over your nose and mouth if you must be around other people or animals, including pets (even at home). Try to stay at least 6 feet away from other people. This will protect the people around you.  Please continue good preventive care measures, including:  frequent hand-washing, avoid touching your face, cover coughs/sneezes, stay out of crowds and keep a 6 foot distance from others.  COVID-19 is a respiratory illness with symptoms that are similar to the flu. Symptoms are typically mild to moderate, but there have been cases of severe illness and death due to the virus.   The following symptoms may appear 2-14 days after exposure: . Fever . Cough . Shortness of breath or difficulty breathing . Chills . Repeated shaking with chills . Muscle pain . Headache . Sore throat . New loss of taste or smell . Fatigue . Congestion or runny nose . Nausea or vomiting . Diarrhea  Go to the nearest hospital ED for assessment if fever/cough/breathlessness are severe or illness seems like a threat to life.  It is vitally important that if you feel that you have an infection such as this virus or any other virus that you stay home and away from places where you may spread it to  others.  You should avoid contact with people age 27 and older.  You may take acetaminophen (Tylenol) as needed for fever.  Reduce your risk of any infection by using the same precautions used for avoiding the common cold or flu:  Marland Kitchen Wash your hands often with soap and warm water for at least 20 seconds.  If soap and water are not readily available, use an alcohol-based hand sanitizer with at least 60% alcohol.  . If coughing or sneezing, cover your mouth and nose by coughing or sneezing into the elbow areas of your shirt or coat, into a tissue or into your sleeve (not your hands). . Avoid shaking hands with others and consider head nods or verbal greetings only. . Avoid touching your eyes, nose, or mouth with unwashed hands.  . Avoid close contact with people who are sick. . Avoid places or events with large numbers of people in one location, like concerts or sporting events. . Carefully consider travel plans you have or are making. . If you are planning any travel outside or inside the [place], visit the CDC's Travelers' Health webpage for the latest health notices. . If you have some symptoms but not all symptoms, continue to monitor at home and seek medical attention if your symptoms worsen. . If you are having a medical emergency, call 911.  HOME CARE . Only take medications as instructed by your medical team. . Drink plenty of fluids and get plenty of rest. . A steam or ultrasonic humidifier can help if you have congestion.   GET HELP RIGHT AWAY IF YOU HAVE EMERGENCY WARNING SIGNS** FOR COVID-19. If you or someone is showing any of these signs seek emergency medical care immediately. Call 911 or proceed to your closest emergency facility if: . You develop worsening high fever. . Trouble breathing . Bluish lips or face . Persistent pain or pressure in the chest . New confusion . Inability to wake or stay awake . You cough up blood. . Your symptoms become more severe  **This list is not  all possible symptoms. Contact your medical provider for any symptoms that are sever or concerning to you.  MAKE SURE YOU   Understand these instructions.  Will watch your condition.  Will get help right away if you are not doing well or get worse.  Your e-visit answers were reviewed by a board certified advanced clinical practitioner to complete your personal care plan.  Depending on the condition, your plan could have included both over the counter or prescription medications.  If there is a problem please reply once you have received a response from your provider.  Your safety is important to [place].  If you have drug allergies check your prescription carefully.    You can use MyChart to ask questions about today's visit, request a non-urgent call back, or ask for a work or school excuse for 24 hours related to this e-Visit. If it has been greater than 24 hours you will need to follow up with your provider, or enter a new e-Visit to address those concerns. You will get an e-mail in the next two days asking about your experience.  I hope that your e-visit has been valuable and will speed your recovery. Thank you for using e-visits.  Approximately 5 minutes of time have been spent researching, coordinating, and implementing care for this patient today.

## 2019-06-14 ENCOUNTER — Ambulatory Visit: Payer: 59 | Attending: Internal Medicine

## 2019-06-14 DIAGNOSIS — Z20822 Contact with and (suspected) exposure to covid-19: Secondary | ICD-10-CM

## 2019-06-15 ENCOUNTER — Encounter: Payer: Self-pay | Admitting: Family Medicine

## 2019-06-15 LAB — NOVEL CORONAVIRUS, NAA: SARS-CoV-2, NAA: NOT DETECTED

## 2019-09-10 ENCOUNTER — Other Ambulatory Visit: Payer: Self-pay | Admitting: Family Medicine

## 2019-09-10 DIAGNOSIS — G43009 Migraine without aura, not intractable, without status migrainosus: Secondary | ICD-10-CM

## 2020-05-06 ENCOUNTER — Other Ambulatory Visit: Payer: Self-pay

## 2020-05-06 ENCOUNTER — Encounter (HOSPITAL_BASED_OUTPATIENT_CLINIC_OR_DEPARTMENT_OTHER): Payer: Self-pay | Admitting: Obstetrics and Gynecology

## 2020-05-06 NOTE — Progress Notes (Signed)
Spoke w/ via phone for pre-op interview--- PT Lab needs dos---- Urine preg (per anes)/  Pre-op orders pending              Lab results------ no COVID test ------ 05-13-2020 @ 0935 Arrive at ------- 0530 NPO after MN  Medications to take morning of surgery ----- Zoloft Diabetic medication ----- n/a Patient Special Instructions ----- n/a Pre-Op special Istructions ----- n/a Patient verbalized understanding of instructions that were given at this phone interview. Patient denies shortness of breath, chest pain, fever, cough at this phone interview.

## 2020-05-13 ENCOUNTER — Other Ambulatory Visit (HOSPITAL_COMMUNITY)
Admission: RE | Admit: 2020-05-13 | Discharge: 2020-05-13 | Disposition: A | Discharge status: Home / Self Care | Payer: 59 | Source: Ambulatory Visit | Attending: Obstetrics and Gynecology | Admitting: Obstetrics and Gynecology

## 2020-05-13 DIAGNOSIS — Z20822 Contact with and (suspected) exposure to covid-19: Secondary | ICD-10-CM | POA: Diagnosis not present

## 2020-05-13 DIAGNOSIS — Z01812 Encounter for preprocedural laboratory examination: Secondary | ICD-10-CM | POA: Diagnosis present

## 2020-05-13 LAB — SARS CORONAVIRUS 2 (TAT 6-24 HRS): SARS Coronavirus 2: NEGATIVE

## 2020-05-15 ENCOUNTER — Ambulatory Visit (HOSPITAL_BASED_OUTPATIENT_CLINIC_OR_DEPARTMENT_OTHER): Payer: 59 | Admitting: Anesthesiology

## 2020-05-15 ENCOUNTER — Encounter (HOSPITAL_BASED_OUTPATIENT_CLINIC_OR_DEPARTMENT_OTHER): Payer: Self-pay | Admitting: Obstetrics and Gynecology

## 2020-05-15 ENCOUNTER — Other Ambulatory Visit: Payer: Self-pay

## 2020-05-15 ENCOUNTER — Ambulatory Visit (HOSPITAL_BASED_OUTPATIENT_CLINIC_OR_DEPARTMENT_OTHER)
Admission: RE | Admit: 2020-05-15 | Discharge: 2020-05-15 | Disposition: A | Discharge status: Home / Self Care | Payer: 59 | Attending: Obstetrics and Gynecology | Admitting: Obstetrics and Gynecology

## 2020-05-15 ENCOUNTER — Encounter (HOSPITAL_BASED_OUTPATIENT_CLINIC_OR_DEPARTMENT_OTHER)
Admission: RE | Disposition: A | Discharge status: Home / Self Care | Payer: Self-pay | Source: Home / Self Care | Attending: Obstetrics and Gynecology

## 2020-05-15 DIAGNOSIS — Z79899 Other long term (current) drug therapy: Secondary | ICD-10-CM | POA: Diagnosis not present

## 2020-05-15 DIAGNOSIS — R102 Pelvic and perineal pain: Secondary | ICD-10-CM | POA: Diagnosis not present

## 2020-05-15 DIAGNOSIS — N801 Endometriosis of ovary: Secondary | ICD-10-CM | POA: Diagnosis not present

## 2020-05-15 DIAGNOSIS — D270 Benign neoplasm of right ovary: Secondary | ICD-10-CM | POA: Insufficient documentation

## 2020-05-15 DIAGNOSIS — N83202 Unspecified ovarian cyst, left side: Secondary | ICD-10-CM | POA: Diagnosis present

## 2020-05-15 DIAGNOSIS — N809 Endometriosis, unspecified: Secondary | ICD-10-CM | POA: Insufficient documentation

## 2020-05-15 DIAGNOSIS — N7011 Chronic salpingitis: Secondary | ICD-10-CM | POA: Diagnosis not present

## 2020-05-15 DIAGNOSIS — K660 Peritoneal adhesions (postprocedural) (postinfection): Secondary | ICD-10-CM | POA: Insufficient documentation

## 2020-05-15 DIAGNOSIS — N926 Irregular menstruation, unspecified: Secondary | ICD-10-CM | POA: Insufficient documentation

## 2020-05-15 DIAGNOSIS — N994 Postprocedural pelvic peritoneal adhesions: Secondary | ICD-10-CM | POA: Insufficient documentation

## 2020-05-15 HISTORY — PX: HYSTEROSCOPY WITH D & C: SHX1775

## 2020-05-15 HISTORY — PX: LAPAROSCOPY: SHX197

## 2020-05-15 HISTORY — DX: Presence of spectacles and contact lenses: Z97.3

## 2020-05-15 HISTORY — DX: Generalized anxiety disorder: F41.1

## 2020-05-15 HISTORY — DX: Unspecified dyspareunia: N94.10

## 2020-05-15 HISTORY — DX: Postcoital and contact bleeding: N93.0

## 2020-05-15 HISTORY — PX: LAPAROSCOPIC OVARIAN CYSTECTOMY: SHX6248

## 2020-05-15 HISTORY — PX: CHROMOPERTUBATION: SHX6288

## 2020-05-15 LAB — TYPE AND SCREEN
ABO/RH(D): O POS
Antibody Screen: NEGATIVE

## 2020-05-15 LAB — ABO/RH: ABO/RH(D): O POS

## 2020-05-15 LAB — POCT PREGNANCY, URINE: Preg Test, Ur: NEGATIVE

## 2020-05-15 SURGERY — DILATATION AND CURETTAGE /HYSTEROSCOPY
Anesthesia: General | Site: Vagina | Laterality: Right

## 2020-05-15 MED ORDER — OXYCODONE HCL 5 MG/5ML PO SOLN: 5.0000 mg | Freq: Once | ORAL | Status: AC | PRN

## 2020-05-15 MED ORDER — LACTATED RINGERS IV SOLN: INTRAVENOUS | Status: DC

## 2020-05-15 MED ORDER — GLYCOPYRROLATE PF 0.2 MG/ML IJ SOSY
PREFILLED_SYRINGE | INTRAMUSCULAR | Status: AC
  Filled 2020-05-15: qty 1

## 2020-05-15 MED ORDER — PROPOFOL 10 MG/ML IV BOLUS
INTRAVENOUS | Status: AC
  Filled 2020-05-15: qty 20

## 2020-05-15 MED ORDER — IBUPROFEN 600 MG PO TABS: 600.0000 mg | ORAL_TABLET | Freq: Four times a day (QID) | ORAL | 1 refills | Status: DC | PRN

## 2020-05-15 MED ORDER — SODIUM CHLORIDE 0.9 % IR SOLN
Status: DC | PRN
  Administered 2020-05-15: 3000 mL

## 2020-05-15 MED ORDER — FENTANYL CITRATE (PF) 100 MCG/2ML IJ SOLN: 25.0000 ug | INTRAMUSCULAR | Status: DC | PRN

## 2020-05-15 MED ORDER — ONDANSETRON HCL 4 MG/2ML IJ SOLN
INTRAMUSCULAR | Status: DC | PRN
  Administered 2020-05-15: 4 mg via INTRAVENOUS

## 2020-05-15 MED ORDER — ROCURONIUM BROMIDE 10 MG/ML (PF) SYRINGE
PREFILLED_SYRINGE | INTRAVENOUS | Status: DC | PRN
  Administered 2020-05-15: 10 mg via INTRAVENOUS
  Administered 2020-05-15: 20 mg via INTRAVENOUS
  Administered 2020-05-15: 50 mg via INTRAVENOUS

## 2020-05-15 MED ORDER — DEXAMETHASONE SODIUM PHOSPHATE 10 MG/ML IJ SOLN
INTRAMUSCULAR | Status: AC
  Filled 2020-05-15: qty 1

## 2020-05-15 MED ORDER — PHENYLEPHRINE 40 MCG/ML (10ML) SYRINGE FOR IV PUSH (FOR BLOOD PRESSURE SUPPORT)
PREFILLED_SYRINGE | INTRAVENOUS | Status: DC | PRN
  Administered 2020-05-15 (×3): 80 ug via INTRAVENOUS

## 2020-05-15 MED ORDER — OXYCODONE-ACETAMINOPHEN 5-325 MG PO TABS: 1.0000 | ORAL_TABLET | ORAL | 0 refills | Status: AC | PRN

## 2020-05-15 MED ORDER — MIDAZOLAM HCL 5 MG/5ML IJ SOLN
INTRAMUSCULAR | Status: DC | PRN
  Administered 2020-05-15: 2 mg via INTRAVENOUS

## 2020-05-15 MED ORDER — KETOROLAC TROMETHAMINE 30 MG/ML IJ SOLN
INTRAMUSCULAR | Status: DC | PRN
  Administered 2020-05-15: 30 mg via INTRAVENOUS

## 2020-05-15 MED ORDER — PROPOFOL 10 MG/ML IV BOLUS
INTRAVENOUS | Status: DC | PRN
  Administered 2020-05-15: 200 mg via INTRAVENOUS

## 2020-05-15 MED ORDER — KETOROLAC TROMETHAMINE 30 MG/ML IJ SOLN: 30.0000 mg | Freq: Once | INTRAMUSCULAR | Status: DC

## 2020-05-15 MED ORDER — EPHEDRINE SULFATE 50 MG/ML IJ SOLN
INTRAMUSCULAR | Status: DC | PRN
  Administered 2020-05-15: 10 mg via INTRAVENOUS
  Administered 2020-05-15: 5 mg via INTRAVENOUS

## 2020-05-15 MED ORDER — ACETAMINOPHEN 500 MG PO TABS
1000.0000 mg | ORAL_TABLET | ORAL | Status: AC
  Administered 2020-05-15: 1000 mg via ORAL

## 2020-05-15 MED ORDER — LIDOCAINE 2% (20 MG/ML) 5 ML SYRINGE
INTRAMUSCULAR | Status: DC | PRN
  Administered 2020-05-15: 80 mg via INTRAVENOUS

## 2020-05-15 MED ORDER — FENTANYL CITRATE (PF) 250 MCG/5ML IJ SOLN
INTRAMUSCULAR | Status: AC
  Filled 2020-05-15: qty 5

## 2020-05-15 MED ORDER — LIDOCAINE HCL (PF) 2 % IJ SOLN
INTRAMUSCULAR | Status: AC
  Filled 2020-05-15: qty 5

## 2020-05-15 MED ORDER — GLYCOPYRROLATE PF 0.2 MG/ML IJ SOSY
PREFILLED_SYRINGE | INTRAMUSCULAR | Status: DC | PRN
  Administered 2020-05-15: .2 mg via INTRAVENOUS

## 2020-05-15 MED ORDER — PHENYLEPHRINE 40 MCG/ML (10ML) SYRINGE FOR IV PUSH (FOR BLOOD PRESSURE SUPPORT)
PREFILLED_SYRINGE | INTRAVENOUS | Status: AC
  Filled 2020-05-15: qty 10

## 2020-05-15 MED ORDER — SUGAMMADEX SODIUM 200 MG/2ML IV SOLN
INTRAVENOUS | Status: DC | PRN
  Administered 2020-05-15: 200 mg via INTRAVENOUS

## 2020-05-15 MED ORDER — AMISULPRIDE (ANTIEMETIC) 5 MG/2ML IV SOLN: 10.0000 mg | Freq: Once | INTRAVENOUS | Status: DC | PRN

## 2020-05-15 MED ORDER — KETOROLAC TROMETHAMINE 30 MG/ML IJ SOLN
INTRAMUSCULAR | Status: AC
  Filled 2020-05-15: qty 1

## 2020-05-15 MED ORDER — METHYLENE BLUE 0.5 % INJ SOLN
INTRAVENOUS | Status: DC | PRN
  Administered 2020-05-15: 1 mL via SUBMUCOSAL

## 2020-05-15 MED ORDER — LACTATED RINGERS IV SOLN
INTRAVENOUS | Status: DC
  Administered 2020-05-15: 1000 mL via INTRAVENOUS

## 2020-05-15 MED ORDER — ONDANSETRON HCL 4 MG/2ML IJ SOLN
INTRAMUSCULAR | Status: AC
  Filled 2020-05-15: qty 2

## 2020-05-15 MED ORDER — POVIDONE-IODINE 10 % EX SWAB: 2.0000 "application " | Freq: Once | CUTANEOUS | Status: DC

## 2020-05-15 MED ORDER — ACETAMINOPHEN 500 MG PO TABS
ORAL_TABLET | ORAL | Status: AC
  Filled 2020-05-15: qty 2

## 2020-05-15 MED ORDER — DEXAMETHASONE SODIUM PHOSPHATE 10 MG/ML IJ SOLN
INTRAMUSCULAR | Status: DC | PRN
  Administered 2020-05-15: 10 mg via INTRAVENOUS

## 2020-05-15 MED ORDER — OXYCODONE HCL 5 MG PO TABS
5.0000 mg | ORAL_TABLET | Freq: Once | ORAL | Status: AC | PRN
  Administered 2020-05-15: 5 mg via ORAL

## 2020-05-15 MED ORDER — BUPIVACAINE HCL (PF) 0.25 % IJ SOLN
INTRAMUSCULAR | Status: DC | PRN
  Administered 2020-05-15: 15 mL

## 2020-05-15 MED ORDER — OXYCODONE HCL 5 MG PO TABS
ORAL_TABLET | ORAL | Status: AC
  Filled 2020-05-15: qty 1

## 2020-05-15 MED ORDER — FENTANYL CITRATE (PF) 100 MCG/2ML IJ SOLN
INTRAMUSCULAR | Status: DC | PRN
  Administered 2020-05-15: 50 ug via INTRAVENOUS
  Administered 2020-05-15: 100 ug via INTRAVENOUS
  Administered 2020-05-15: 50 ug via INTRAVENOUS

## 2020-05-15 MED ORDER — ROCURONIUM BROMIDE 10 MG/ML (PF) SYRINGE
PREFILLED_SYRINGE | INTRAVENOUS | Status: AC
  Filled 2020-05-15: qty 10

## 2020-05-15 MED ORDER — MIDAZOLAM HCL 2 MG/2ML IJ SOLN
INTRAMUSCULAR | Status: AC
  Filled 2020-05-15: qty 2

## 2020-05-15 MED ORDER — EPHEDRINE 5 MG/ML INJ
INTRAVENOUS | Status: AC
  Filled 2020-05-15: qty 10

## 2020-05-15 MED ORDER — ONDANSETRON HCL 4 MG/2ML IJ SOLN: 4.0000 mg | Freq: Once | INTRAMUSCULAR | Status: DC | PRN

## 2020-05-15 SURGICAL SUPPLY — 47 items
ADH SKN CLS APL DERMABOND .7 (GAUZE/BANDAGES/DRESSINGS) ×5
BAG RETRIEVAL 10 (BASKET) ×1
CABLE HIGH FREQUENCY MONO STRZ (ELECTRODE) IMPLANT
CANISTER SUCT 3000ML PPV (MISCELLANEOUS) ×6 IMPLANT
CATH ROBINSON RED A/P 16FR (CATHETERS) ×6 IMPLANT
COVER WAND RF STERILE (DRAPES) ×6 IMPLANT
DECANTER SPIKE VIAL GLASS SM (MISCELLANEOUS) IMPLANT
DERMABOND ADVANCED (GAUZE/BANDAGES/DRESSINGS) ×1
DERMABOND ADVANCED .7 DNX12 (GAUZE/BANDAGES/DRESSINGS) ×5 IMPLANT
DILATOR CANAL MILEX (MISCELLANEOUS) IMPLANT
DRSG OPSITE POSTOP 3X4 (GAUZE/BANDAGES/DRESSINGS) ×6 IMPLANT
DURAPREP 26ML APPLICATOR (WOUND CARE) ×6 IMPLANT
GAUZE 4X4 16PLY RFD (DISPOSABLE) ×6 IMPLANT
GLOVE BIO SURGEON STRL SZ 6.5 (GLOVE) ×6 IMPLANT
GLOVE BIOGEL PI IND STRL 7.5 (GLOVE) ×15 IMPLANT
GLOVE BIOGEL PI INDICATOR 7.5 (GLOVE) ×3
GLOVE ECLIPSE 7.5 STRL STRAW (GLOVE) ×6 IMPLANT
GLOVE ORTHO TXT STRL SZ7.5 (GLOVE) ×12 IMPLANT
GOWN STRL REUS W/TWL LRG LVL3 (GOWN DISPOSABLE) ×18 IMPLANT
GOWN STRL REUS W/TWL XL LVL3 (GOWN DISPOSABLE) ×6 IMPLANT
IV NS IRRIG 3000ML ARTHROMATIC (IV SOLUTION) ×6 IMPLANT
KIT PROCEDURE FLUENT (KITS) ×12 IMPLANT
KIT TURNOVER CYSTO (KITS) ×6 IMPLANT
MANIPULATOR UTERINE 7CM CLEARV (MISCELLANEOUS) ×6 IMPLANT
NEEDLE INSUFFLATION 120MM (ENDOMECHANICALS) ×6 IMPLANT
NS IRRIG 500ML POUR BTL (IV SOLUTION) ×6 IMPLANT
PACK LAPAROSCOPY BASIN (CUSTOM PROCEDURE TRAY) ×6 IMPLANT
PACK TRENDGUARD 450 HYBRID PRO (MISCELLANEOUS) ×5 IMPLANT
PACK VAGINAL MINOR WOMEN LF (CUSTOM PROCEDURE TRAY) ×6 IMPLANT
PAD OB MATERNITY 4.3X12.25 (PERSONAL CARE ITEMS) ×6 IMPLANT
PROTECTOR NERVE ULNAR (MISCELLANEOUS) ×12 IMPLANT
SET SUCTION IRRIG HYDROSURG (IRRIGATION / IRRIGATOR) ×6 IMPLANT
SET TUBE SMOKE EVAC HIGH FLOW (TUBING) ×6 IMPLANT
SHEARS HARMONIC ACE PLUS 36CM (ENDOMECHANICALS) ×6 IMPLANT
SLEEVE XCEL OPT CAN 5 100 (ENDOMECHANICALS) IMPLANT
SOLUTION ELECTROLUBE (MISCELLANEOUS) IMPLANT
SUT VIC AB 3-0 PS2 18 (SUTURE) ×6
SUT VIC AB 3-0 PS2 18XBRD (SUTURE) ×5 IMPLANT
SUT VICRYL 0 UR6 27IN ABS (SUTURE) ×6 IMPLANT
SYS BAG RETRIEVAL 10MM (BASKET) ×5
SYSTEM BAG RETRIEVAL 10MM (BASKET) ×5 IMPLANT
SYSTEM CARTER THOMASON II (TROCAR) ×6 IMPLANT
TOWEL OR 17X26 10 PK STRL BLUE (TOWEL DISPOSABLE) ×12 IMPLANT
TRENDGUARD 450 HYBRID PRO PACK (MISCELLANEOUS) ×6
TROCAR BLADELESS OPT 5 100 (ENDOMECHANICALS) ×6 IMPLANT
TROCAR XCEL NON-BLD 11X100MML (ENDOMECHANICALS) ×6 IMPLANT
WARMER LAPAROSCOPE (MISCELLANEOUS) ×6 IMPLANT

## 2020-05-15 NOTE — Discharge Instructions (Signed)
Call office with any concerns [phone]   D & C Home care Instructions:   Personal hygiene:  Used sanitary napkins for vaginal drainage not tampons. Shower or tub bathe the day after your procedure. No douching until bleeding stops. Always wipe from front to back after  Elimination.  Activity: Do not drive or operate any equipment today. The effects of the anesthesia are still present and drowsiness may result. Rest today, not necessarily flat bed rest, just take it easy. You may resume your normal activity in one to 2 days.  Sexual activity: No intercourse for one week or as indicated by your physician  Diet: Eat a light diet as desired this evening. You may resume a regular diet tomorrow.  Return to work: One to 2 days.  General Expectations of your surgery: Vaginal bleeding should be no heavier than a normal period. Spotting may continue up to 10 days. Mild cramps may continue for a couple of days. You may have a regular period in 2-6 weeks.  Unexpected observations call your doctor if these occur: persistent or heavy bleeding. Severe abdominal cramping or pain. Elevation of temperature greater than 100F.  Call for an appointment in one week.    DISCHARGE INSTRUCTIONS: Laparoscopy  The following instructions have been prepared to help you care for yourself upon your return home today.  Wound care: Marland Kitchen Do not get the incision wet for the first 24 hours. The incision should be kept clean and dry. . The Band-Aids or dressings may be removed the day after surgery. . Should the incision become sore, red, and swollen after the first week, check with your doctor.  Personal hygiene: . Shower the day after your procedure.  Activity and limitations: . Do NOT drive or operate any equipment today. . Do NOT lift anything more than 15 pounds for 2-3 weeks after surgery. . Do NOT rest in bed all day. . Walking is encouraged. Walk each day, starting slowly with 5-minute walks 3 or 4  times a day. Slowly increase the length of your walks. . Walk up and down stairs slowly. . Do NOT do strenuous activities, such as golfing, playing tennis, bowling, running, biking, weight lifting, gardening, mowing, or vacuuming for 2-4 weeks. Ask your doctor when it is okay to start.  Diet: Eat a light meal as desired this evening. You may resume your usual diet tomorrow.  Return to work: This is dependent on the type of work you do. For the most part you can return to a desk job within a week of surgery. If you are more active at work, please discuss this with your doctor.  What to expect after your surgery: You may have a slight burning sensation when you urinate on the first day. You may have a very small amount of blood in the urine. Expect to have a small amount of vaginal discharge/light bleeding for 1-2 weeks. It is not unusual to have abdominal soreness and bruising for up to 2 weeks. You may be tired and need more rest for about 1 week. You may experience shoulder pain for 24-72 hours. Lying flat in bed may relieve it.  Call your doctor for any of the following: . Develop a fever of 100.4 or greater . Inability to urinate 6 hours after discharge from hospital . Severe pain not relieved by pain medications . Persistent of heavy bleeding at incision site . Redness or swelling around incision site after a week . Increasing nausea or vomiting  Post Anesthesia Home Care Instructions  Activity: Get plenty of rest for the remainder of the day. A responsible adult should stay with you for 24 hours following the procedure.  For the next 24 hours, DO NOT: -Drive a car -Paediatric nurse -Drink alcoholic beverages -Take any medication unless instructed by your physician -Make any legal decisions or sign important papers.  Meals: Start with liquid foods such as gelatin or soup. Progress to regular foods as tolerated. Avoid greasy, spicy, heavy foods. If nausea and/or vomiting occur,  drink only clear liquids until the nausea and/or vomiting subsides. Call your physician if vomiting continues.  Special Instructions/Symptoms: Your throat may feel dry or sore from the anesthesia or the breathing tube placed in your throat during surgery. If this causes discomfort, gargle with warm salt water. The discomfort should disappear within 24 hours.  If you had a scopolamine patch placed behind your ear for the management of post- operative nausea and/or vomiting:  1. The medication in the patch is effective for 72 hours, after which it should be removed.  Wrap patch in a tissue and discard in the trash. Wash hands thoroughly with soap and water. 2. You may remove the patch earlier than 72 hours if you experience unpleasant side effects which may include dry mouth, dizziness or visual disturbances. 3. Avoid touching the patch. Wash your hands with soap and water after contact with the patch.

## 2020-05-15 NOTE — H&P (Signed)
Stacy Hawkins is an 34 y.o.G0 female here for scheduled surgery. Pt has complaint of pelvic pain, irregular menses and post coital spotting. Pt with known ovarian cyst and hydrosalpinx on [place].    Pertinent Gynecological History: Menses: flow is moderate Bleeding: intermenstrual bleeding Contraception: none DES exposure: denies Blood transfusions: none Sexually transmitted diseases: no past history Previous GYN Procedures: none  Last mammogram: n/a Date:  Last pap: normal Date: 06/2018 OB History: G0, P0   Menstrual History: Menarche age: 73 Patient's last menstrual period was 05/06/2020 (exact date).    Past Medical History:  Diagnosis Date  . Arthritis   . Dyspareunia, female   . Eczema   . GAD (generalized anxiety disorder)   . GERD (gastroesophageal reflux disease)   . Migraines   . Ovarian cyst   . Ovarian cyst   . Postcoital bleeding   . Wears contact lenses     Past Surgical History:  Procedure Laterality Date  . HYSTEROSCOPY WITH D & C  08-29-2007  @[facility]    AND POLYPECTOMY  . NASAL SEPTUM SURGERY  2006  . WISDOM TOOTH EXTRACTION  2006    Family History  Problem Relation Age of Onset  . Arthritis Mother   . Asthma Mother   . Depression Mother   . Miscarriages / [place] Mother   . Alcohol abuse Father   . Depression Father   . Diabetes Father   . Drug abuse Father   . Hyperlipidemia Father   . Hypertension Father   . Mental illness Father   . Arthritis Father   . Asthma Brother   . Depression Brother   . Drug abuse Brother   . Learning disabilities Brother   . Mental illness Brother   . Arthritis Maternal Grandmother   . Depression Maternal Grandmother   . Macular degeneration Maternal Grandmother   . Hearing loss Maternal Grandfather   . Heart disease Maternal Grandfather   . Heart attack Maternal Grandfather   . Arthritis Paternal Grandmother   . Heart disease Paternal Grandmother   . Dementia Paternal Grandmother   . Heart  disease Paternal Grandfather   . Hyperlipidemia Paternal Grandfather   . Hypertension Paternal Grandfather   . Depression Paternal Grandfather   . Diabetes Paternal Grandfather     Social History:  reports that she has never smoked. She has never used smokeless tobacco. She reports current alcohol use. She reports that she does not use drugs.  Allergies: No Known Allergies  Medications Prior to Admission  Medication Sig Dispense Refill Last Dose  . ibuprofen (ADVIL) 200 MG tablet Take 200 mg by mouth every 6 (six) hours as needed.   05/09/2020  . Melatonin 5 MG TABS Take 5 mg by mouth at bedtime.   05/08/2020  . Multiple Vitamins-Minerals (QC WOMENS DAILY MULTIVITAMIN PO) Women's Daily Multivitamin   05/08/2020  . sertraline (ZOLOFT) 100 MG tablet Take 100 mg by mouth daily.    05/15/2020 at Unknown time  . SUMAtriptan (IMITREX) 50 MG tablet TAKE 1 TAB BY MOUTH DAILY AS NEEDED FOR MIGRAINE. MAY REPEAT IN 2 HOURS IF HEADACHE PERSISTS/RECURS. 10 tablet 1 Past Month at Unknown time  . SPRINTEC 28 0.25-35 MG-MCG tablet Take 1 tablet by mouth daily. (Patient not taking: Reported on 05/06/2020)   Not Taking at Unknown time    Review of Systems  Constitutional: Negative for activity change, appetite change and fatigue.  Eyes: Negative for photophobia and visual disturbance.  Respiratory: Negative for chest tightness and shortness  of breath.   Cardiovascular: Negative for chest pain, palpitations and leg swelling.  Gastrointestinal: Positive for abdominal pain.  Genitourinary: Positive for menstrual problem and pelvic pain.  Musculoskeletal: Negative for myalgias.  Neurological: Negative for headaches.  Psychiatric/Behavioral: The patient is not nervous/anxious.     Blood pressure 139/74, pulse 71, temperature (!) 97.2 F (36.2 C), temperature source Oral, resp. rate 18, height 5\' 5"  (1.651 m), weight 107 kg, last menstrual period 05/06/2020, SpO2 100 %. Physical Exam Vitals and nursing  note reviewed. Exam conducted with a chaperone present.  Constitutional:      Appearance: Normal appearance.  Cardiovascular:     Pulses: Normal pulses.  Pulmonary:     Effort: Pulmonary effort is normal.  Abdominal:     General: Abdomen is flat.  Musculoskeletal:        General: Normal range of motion.     Cervical back: Normal range of motion.  Skin:    General: Skin is warm.     Capillary Refill: Capillary refill takes 2 to 3 seconds.  Neurological:     General: No focal deficit present.     Mental Status: She is alert.  Psychiatric:        Mood and Affect: Mood normal.        Behavior: Behavior normal.        Thought Content: Thought content normal.        Judgment: Judgment normal.     Results for orders placed or performed during the hospital encounter of 05/15/20 (from the past 24 hour(s))  Pregnancy, urine POC     Status: None   Collection Time: 05/15/20  5:49 AM  Result Value Ref Range   Preg Test, Ur NEGATIVE NEGATIVE  Type and screen [facility]     Status: None   Collection Time: 05/15/20  6:13 AM  Result Value Ref Range   ABO/RH(D) O POS    Antibody Screen NEG    Sample Expiration      05/18/2020,2359 Performed at [facility], [place] [address], [place], [place] [address]     No results found.  Assessment/Plan: 34yo prime G24 female here for hysteroscopy dilation and curettage, poss right laparoscopic salpingectomy, left ovarian cystectomy, possible left oophorectomy, possible lysis of endometriosis, possible chromotubation -Admit -ERAS protocol -Reiterated expectations in surgery and post operatively -To OR when ready  Marcquis Ridlon W Bruce Churilla 05/15/2020, 7:31 AM

## 2020-05-15 NOTE — Anesthesia Postprocedure Evaluation (Signed)
Anesthesia Post Note  Patient: Stacy Hawkins  Procedure(s) Performed: DILATATION AND CURETTAGE /HYSTEROSCOPY (N/A Vagina ) LAPAROSCOPY OPERATIVE (Bilateral Abdomen) LAPAROSCOPIC LEFT OVARIAN DRAINAGE AND EXCISION OF RIGHT ADNEXAL MASS (Bilateral Abdomen) CHROMOPERTUBATION (Bilateral Abdomen)     Patient location during evaluation: PACU Anesthesia Type: General Level of consciousness: awake and alert Pain management: pain level controlled Vital Signs Assessment: post-procedure vital signs reviewed and stable Respiratory status: spontaneous breathing, nonlabored ventilation and respiratory function stable Cardiovascular status: blood pressure returned to baseline and stable Postop Assessment: no apparent nausea or vomiting Anesthetic complications: no   No complications documented.  Last Vitals:  Vitals:   05/15/20 1000 05/15/20 1015  BP: 123/68 121/69  Pulse: 87 92  Resp: 19 13  Temp:    SpO2: 98% 95%    Last Pain:  Vitals:   05/15/20 1045  TempSrc:   PainSc: 7                  Bastien Strawser E Renly Guedes

## 2020-05-15 NOTE — Anesthesia Preprocedure Evaluation (Addendum)
Anesthesia Evaluation  Patient identified by MRN, date of birth, ID band Patient awake    Reviewed: Allergy & Precautions, NPO status , Patient's Chart, lab work & pertinent test results  History of Anesthesia Complications Negative for: history of anesthetic complications  Airway Mallampati: III  TM Distance: >3 FB Neck ROM: Full    Dental  (+) Teeth Intact   Pulmonary neg pulmonary ROS,    Pulmonary exam normal        Cardiovascular negative cardio ROS Normal cardiovascular exam     Neuro/Psych  Headaches, Anxiety    GI/Hepatic Neg liver ROS, GERD  ,  Endo/Other  negative endocrine ROS  Renal/GU negative Renal ROS  negative genitourinary   Musculoskeletal negative musculoskeletal ROS (+)   Abdominal   Peds  Hematology negative hematology ROS (+)   Anesthesia Other Findings   Reproductive/Obstetrics Dyspareunia/postcoital bleeding                            Anesthesia Physical Anesthesia Plan  ASA: II  Anesthesia Plan: General   Post-op Pain Management:    Induction: Intravenous  PONV Risk Score and Plan: 4 or greater and Ondansetron, Dexamethasone, Treatment may vary due to age or medical condition and Midazolam  Airway Management Planned: Oral ETT  Additional Equipment: None  Intra-op Plan:   Post-operative Plan: Extubation in OR  Informed Consent: I have reviewed the patients History and Physical, chart, labs and discussed the procedure including the risks, benefits and alternatives for the proposed anesthesia with the patient or authorized representative who has indicated his/her understanding and acceptance.     Dental advisory given  Plan Discussed with:   Anesthesia Plan Comments:        Anesthesia Quick Evaluation

## 2020-05-15 NOTE — Transfer of Care (Signed)
Immediate Anesthesia Transfer of Care Note  Patient: Stacy Hawkins  Procedure(s) Performed: DILATATION AND CURETTAGE /HYSTEROSCOPY (N/A Vagina ) LAPAROSCOPY OPERATIVE (Bilateral Abdomen) LAPAROSCOPIC LEFT OVARIAN DRAINAGE AND EXCISION OF RIGHT ADNEXAL MASS (Bilateral Abdomen) CHROMOPERTUBATION (Bilateral Abdomen)  Patient Location: PACU  Anesthesia Type:General  Level of Consciousness: awake, alert , oriented and patient cooperative  Airway & Oxygen Therapy: Patient Spontanous Breathing  Post-op Assessment: Report given to RN and Post -op Vital signs reviewed and stable  Post vital signs: Reviewed and stable  Last Vitals:  Vitals Value Taken Time  BP 122/73 05/15/20 0931  Temp 36.5 C 05/15/20 0932  Pulse 81 05/15/20 0934  Resp 22 05/15/20 0934  SpO2 100 % 05/15/20 0934  Vitals shown include unvalidated device data.  Last Pain:  Vitals:   05/15/20 0628  TempSrc: Oral  PainSc: 2       Patients Stated Pain Goal: 6 (59/93/57 0177)  Complications: No complications documented.

## 2020-05-15 NOTE — Op Note (Signed)
Operative Note    Preoperative Diagnosis 1. Left ovarian cyst ( suspected endometrioma) 2. Right hydrosalpinx 3. Irregular bleeding  Postoperative Diagnosis 1. Left ovarian endometrioma 2. Right adnexal mass 3. Endometriosis 4. Severe adhesions of posterior uterus to bowel 5. Patent bilateral fallopian tubes 6. Follicular phase endometrium   Procedure Hysteroscopy with dilation and curettage, Operative laparoscopy with left ovarian endometrioma drainage, excision of right adnexal mass, chromopertubation  Surgeon: Mickle Mallory DO Assistant: Cheri Fowler, MD  Anesthesia: General   Fluids: 1L LR EBL: <64ml UOP: 131ml   Findings: Stage 3-4 endometriosis with adhesions of posterior uterus to adnexa and bowel; left ovarian endometrioma, right adnexal multilobular cystic mass, normal appearing bilateral fallopian tubes, intrabdominal adhesions   Specimen: Right adnexal mass, endometrial currettings    Procedure Note Consent reviewed and confirmed in periop area. Patient was taken to the operating room where general anesthesia was administered without difficulty. She was then prepped and draped in the normal sterile fashion while in the dorsal lithotomy position with her arms tucked at her sides. Her bladder was drained of 100cc of urine using a red rubber.  An appropriate timeout was performed.  Next, a weighted speculum was then placed vaginally, a lateral sims anteriorly and the cervix visualized. The cervix was dilated to accommodate a hysteroscope. Upon entry, the endometrial lining was noted to be in follicular phase. A sharp currettage was then performed with tissue sent to pathology for evaluation. The hysteroscope was then removed and a clearview uterine manipulator placed. Patients legs were then lowered and attention turned on the to her abdomen. The umbilicus was injected with 3-5cc of 0.25% marcained and a 5 mm umbilical incision made. A 73mm optiview trocar and camera were  easily introduced into the abdominal cavity.Gas flow was then applied and a pneumoperitoneum obtained with approximate 3 L of CO2 gas. A second and third 20mm sites were placed in the left and right lower quadrants under direct visualization.  The patient was placed in trendelenberg and the uterus anteverted. A large left ovarian cyst was noted on gross evaluation. It was further noted to be adhered to the posterior part of the uterus and smaller adhesions to surrounding bowel.  The right adnexa was noted to have a large multilobed clear and solid appearing cystic mass attached to the uterine cornua and right ovary.  Both fallopian tubes appeared to be normal in appearance. The posterior part of the uterus was also noted to be adhered to a large portion of large bowel. The cul-de-sac was obliterated. Endometriosis was noted in the deep recess of the gutters.  The left ovarian cyst was then drained by using the harmonic scalpel used to puncture a hole in it. It was copiously irrigated till all the fluid was evacuated.  Next, the right adnexal mass was excised hemostatically also using the harmonic scalpel. The mass was removed by placing it in an endocatch bag and removal effected through the left port site that had been extended to accommodate and 89mm trocar.   A chromopertubation was then performed through the clearview manipulator. Both fallopian tubes were confirmed to be patent however the posterior aspect of the uterus also stained blue with the methylene blue dye.  The remainder of the pelvis and abdomen were inspected. Small areas of adhesions were noted otherwise normal anatomy appreciated.  The fascia under the 40mm port site in the left lower quadrant was closed with the help of a carter thomasen instrument under visualization. The gas was allowed to  escape through the other port sites with anesthesia effecting deep breaths. The trocars were removed under visualization and the incisions closed with  4-0 vicryl then dermabond applied.  Patient was then awakened and taken to the recovery room in stable condition. Counts were all confirmed x 2

## 2020-05-15 NOTE — Anesthesia Procedure Notes (Signed)
Procedure Name: Intubation Date/Time: 05/15/2020 7:48 AM Performed by: Rogers Blocker, CRNA Pre-anesthesia Checklist: Patient identified, Emergency Drugs available, Suction available and Patient being monitored Patient Re-evaluated:Patient Re-evaluated prior to induction Oxygen Delivery Method: Circle System Utilized Preoxygenation: Pre-oxygenation with 100% oxygen Induction Type: IV induction Ventilation: Mask ventilation without difficulty Laryngoscope Size: Mac and 3 Grade View: Grade I Tube type: Oral Number of attempts: 1 Airway Equipment and Method: Stylet Placement Confirmation: ETT inserted through vocal cords under direct vision,  positive ETCO2 and breath sounds checked- equal and bilateral Secured at: 22 cm Tube secured with: Tape Dental Injury: Teeth and Oropharynx as per pre-operative assessment

## 2020-05-16 ENCOUNTER — Encounter (HOSPITAL_BASED_OUTPATIENT_CLINIC_OR_DEPARTMENT_OTHER): Payer: Self-pay | Admitting: Obstetrics and Gynecology

## 2020-05-16 LAB — SURGICAL PATHOLOGY

## 2020-06-05 IMAGING — DX LEFT FOOT - COMPLETE 3+ VIEW
3 series · 3 of 3 positions shown · non-contrast
Comparison: None.

CLINICAL DATA: Tenderness without history of trauma

EXAM:
LEFT FOOT - COMPLETE 3+ VIEW

[foot ap]
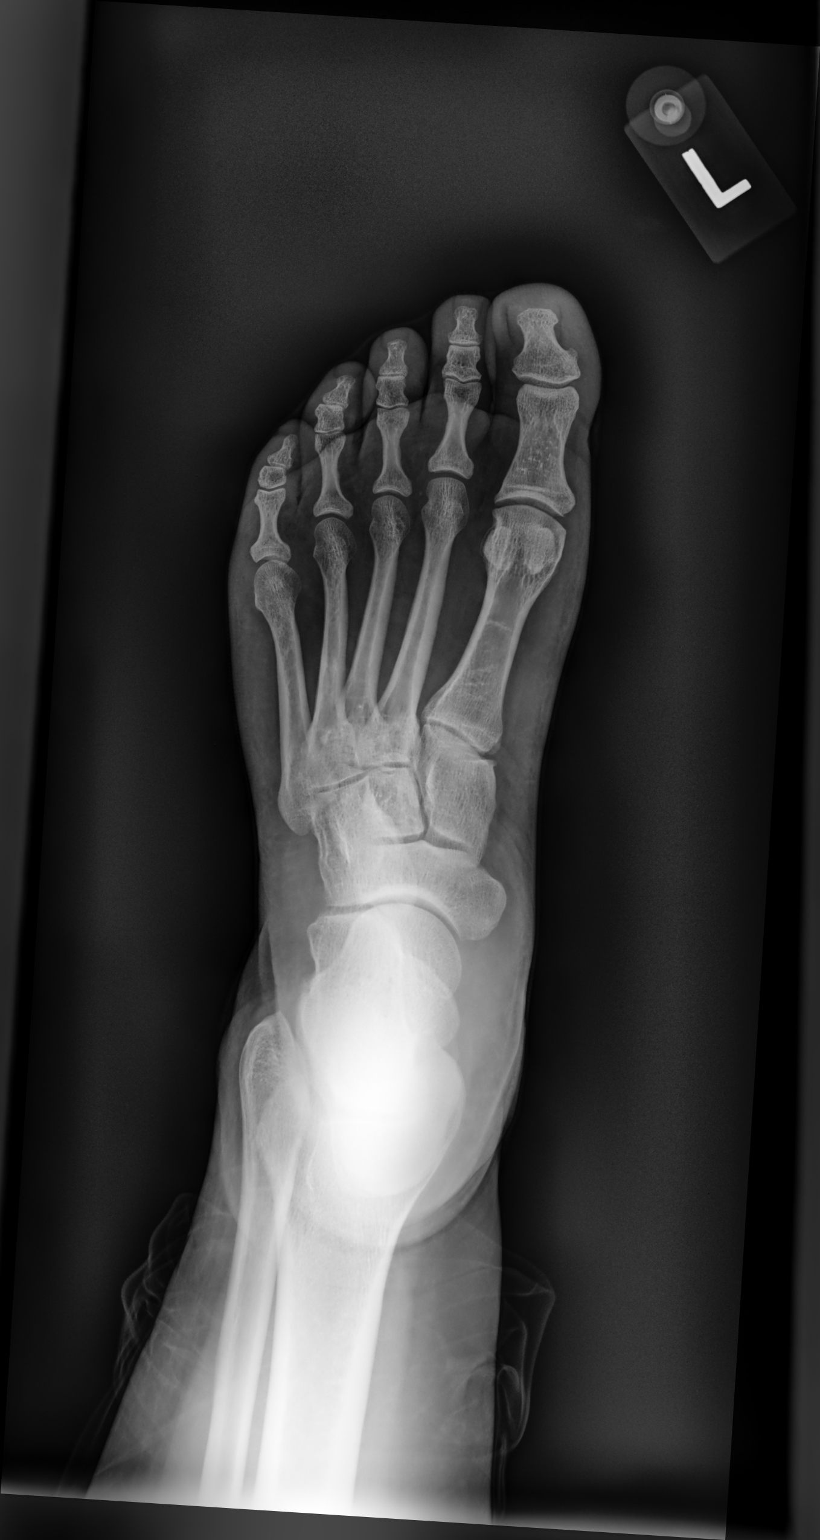

[foot mlo]
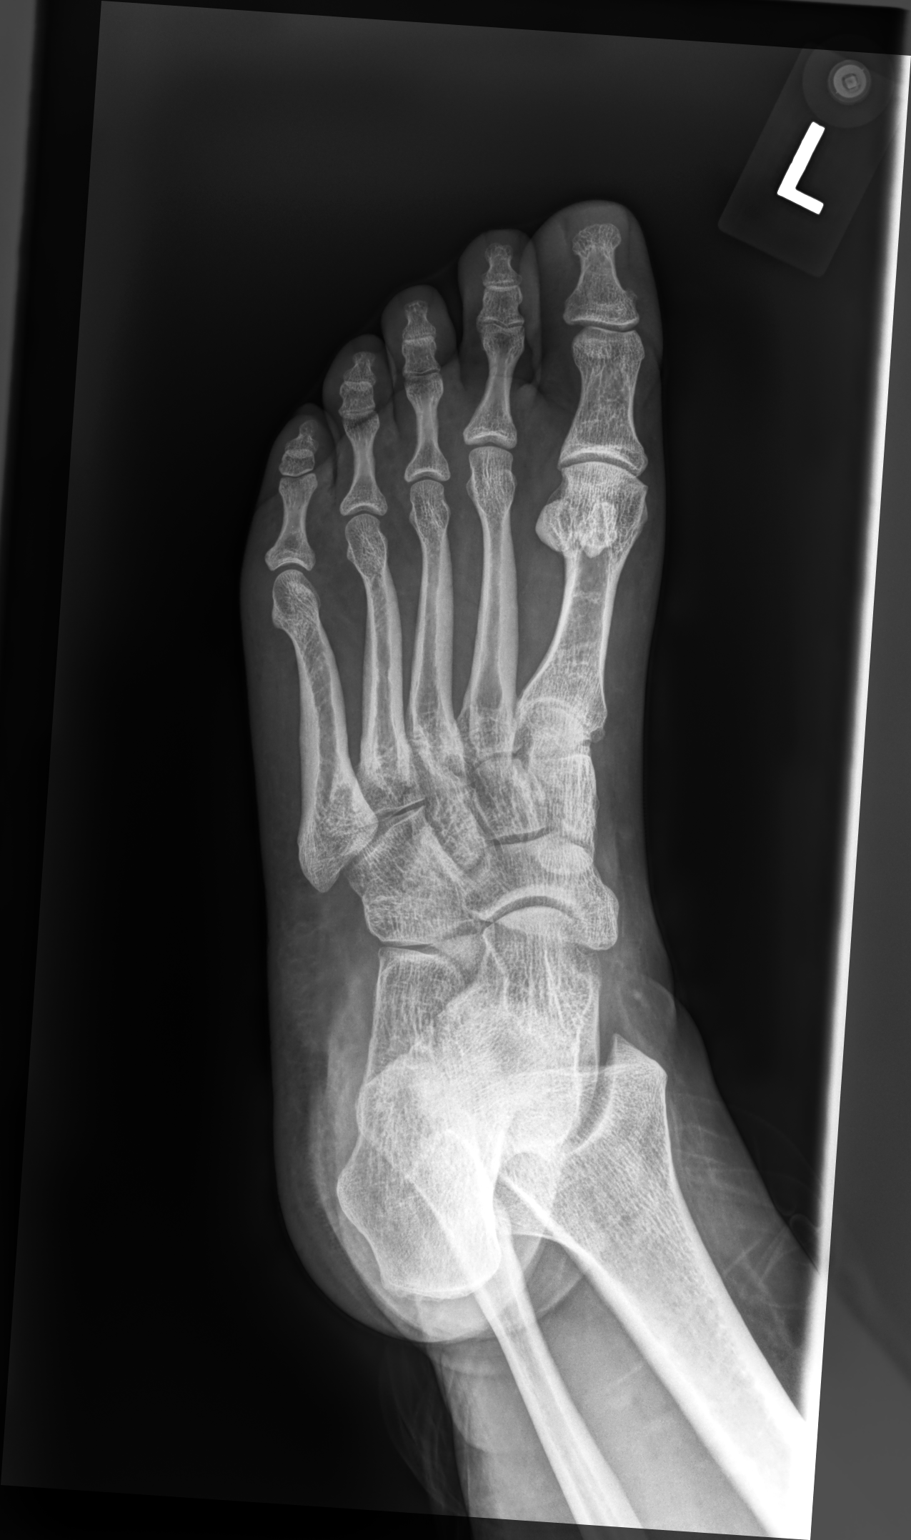

[foot lat]
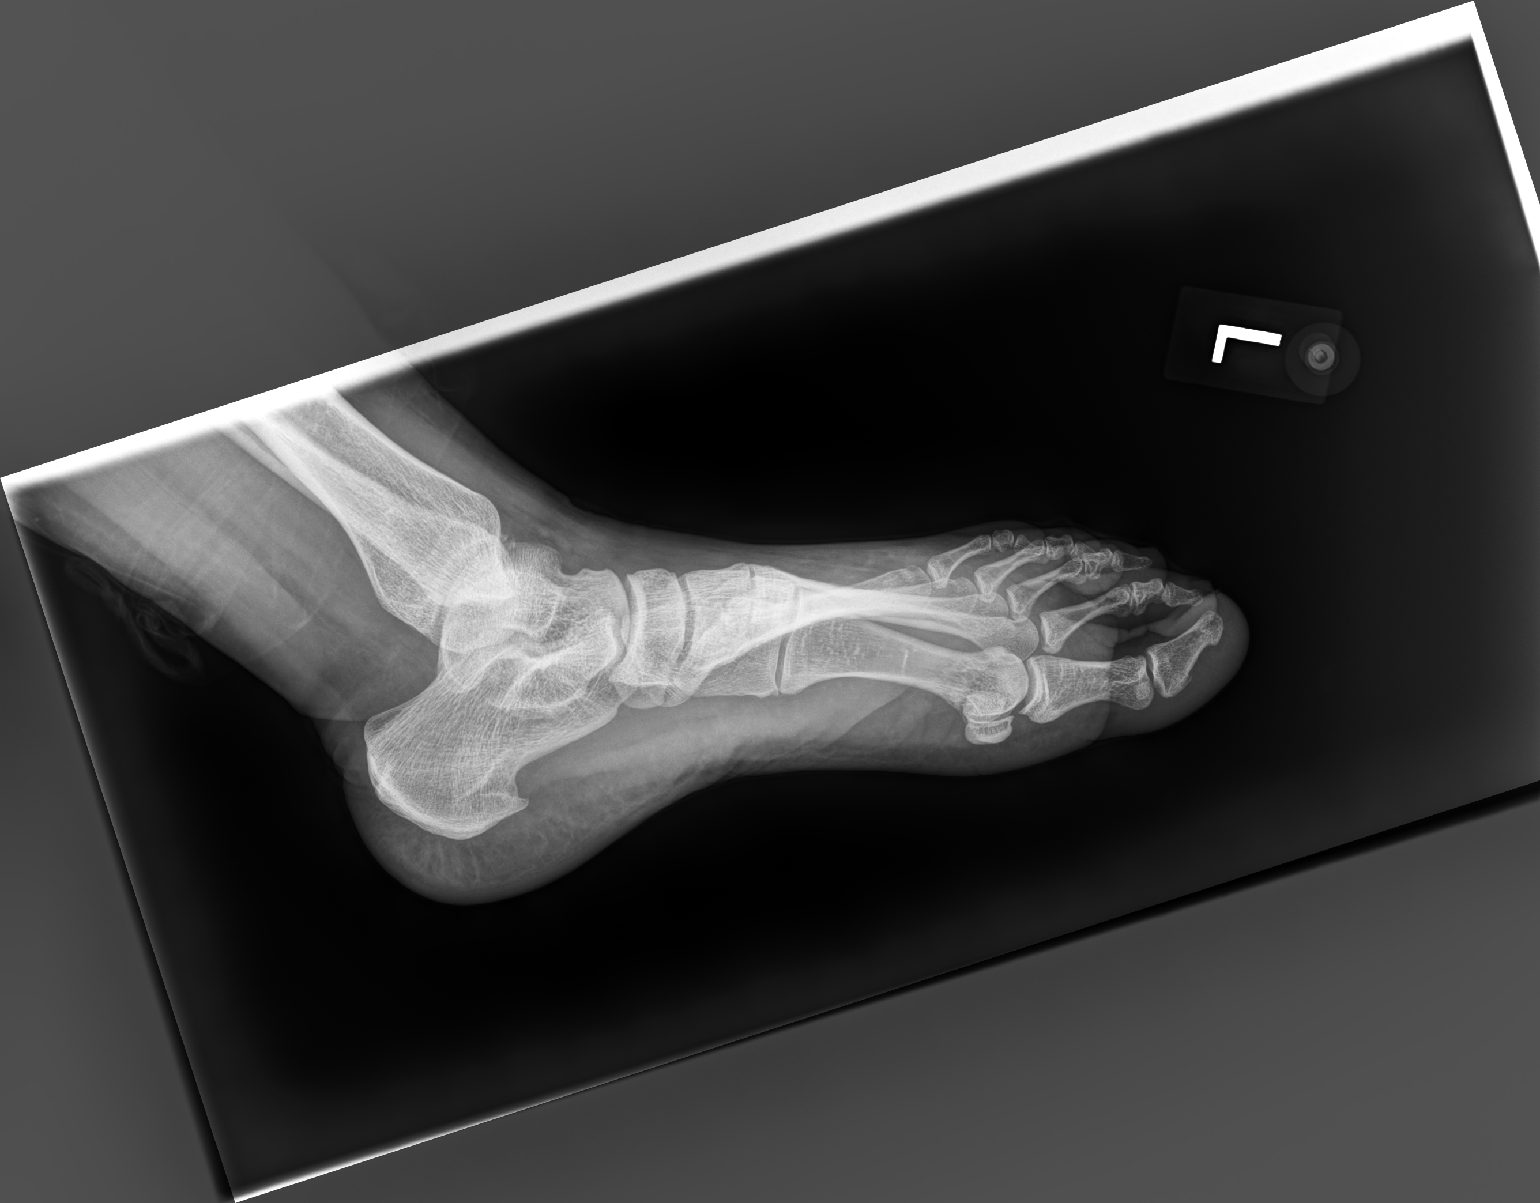

[3 of 3 positions shown; findings below may reference images not displayed]

FINDINGS: There is no evidence of fracture or dislocation. There is no
evidence of arthropathy or other focal bone abnormality. Soft
tissues are unremarkable. A small plantar calcaneal spur is noted.
IMPRESSION: Negative.

## 2020-07-03 ENCOUNTER — Other Ambulatory Visit: Payer: Self-pay | Admitting: Family Medicine

## 2020-07-03 DIAGNOSIS — G43009 Migraine without aura, not intractable, without status migrainosus: Secondary | ICD-10-CM

## 2020-09-19 ENCOUNTER — Other Ambulatory Visit: Payer: Self-pay

## 2020-09-19 ENCOUNTER — Encounter: Payer: Self-pay | Admitting: Family Medicine

## 2020-09-19 ENCOUNTER — Ambulatory Visit (INDEPENDENT_AMBULATORY_CARE_PROVIDER_SITE_OTHER): Payer: 59 | Admitting: Family Medicine

## 2020-09-19 VITALS — BP 118/70 | HR 67 | Resp 12 | Ht 65.0 in | Wt 239.0 lb

## 2020-09-19 DIAGNOSIS — E049 Nontoxic goiter, unspecified: Secondary | ICD-10-CM | POA: Diagnosis not present

## 2020-09-19 DIAGNOSIS — Z13220 Encounter for screening for lipoid disorders: Secondary | ICD-10-CM

## 2020-09-19 DIAGNOSIS — Z130 Encounter for screening for diseases of the blood and blood-forming organs and certain disorders involving the immune mechanism: Secondary | ICD-10-CM

## 2020-09-19 DIAGNOSIS — Z1159 Encounter for screening for other viral diseases: Secondary | ICD-10-CM

## 2020-09-19 DIAGNOSIS — G43009 Migraine without aura, not intractable, without status migrainosus: Secondary | ICD-10-CM

## 2020-09-19 DIAGNOSIS — Z13228 Encounter for screening for other metabolic disorders: Secondary | ICD-10-CM

## 2020-09-19 DIAGNOSIS — Z0000 Encounter for general adult medical examination without abnormal findings: Secondary | ICD-10-CM | POA: Diagnosis not present

## 2020-09-19 DIAGNOSIS — Z1329 Encounter for screening for other suspected endocrine disorder: Secondary | ICD-10-CM

## 2020-09-19 LAB — BASIC METABOLIC PANEL
BUN: 9 mg/dL (ref 6–23)
CO2: 28 mEq/L (ref 19–32)
Calcium: 9 mg/dL (ref 8.4–10.5)
Chloride: 104 mEq/L (ref 96–112)
Creatinine, Ser: 0.68 mg/dL (ref 0.40–1.20)
GFR: 114.01 mL/min (ref >60.00)
Glucose, Bld: 76 mg/dL (ref 70–99)
Potassium: 4.3 mEq/L (ref 3.5–5.1)
Sodium: 137 mEq/L (ref 135–145)

## 2020-09-19 LAB — LIPID PANEL
Cholesterol: 134 mg/dL (ref 0–200)
HDL: 42.9 mg/dL (ref >39.00)
LDL Cholesterol: 72 mg/dL (ref 0–99)
NonHDL: 90.93
Total CHOL/HDL Ratio: 3
Triglycerides: 93 mg/dL (ref 0.0–149.0)
VLDL: 18.6 mg/dL (ref 0.0–40.0)

## 2020-09-19 LAB — HEMOGLOBIN A1C: Hgb A1c MFr Bld: 5 % (ref 4.6–6.5)

## 2020-09-19 LAB — TSH: TSH: 1.75 u[IU]/mL (ref 0.35–4.50)

## 2020-09-19 MED ORDER — SUMATRIPTAN SUCCINATE 50 MG PO TABS: ORAL_TABLET | ORAL | 3 refills | Status: DC

## 2020-09-19 NOTE — Patient Instructions (Addendum)
Today you have you routine preventive visit. A few things to remember from today's visit:  Routine general medical examination at a health care facility  Encounter for HCV screening test for low risk patient - Plan: Hepatitis C antibody screen  Migraine without aura and without status migrainosus, not intractable - Plan: SUMAtriptan (IMITREX) 50 MG tablet, TSH  Screening for lipoid disorders - Plan: Lipid panel  Screening for endocrine, metabolic and immunity disorder - Plan: Basic metabolic panel, Hemoglobin A1c  Enlarged thyroid gland  If you need refills please call your pharmacy. Do not use My Chart to request refills or for acute issues that need immediate attention.   Please be sure medication list is accurate. If a new problem present, please set up appointment sooner than planned today.  At least 150 minutes of moderate exercise per week, daily brisk walking for 15-30 min is a good exercise option. Healthy diet low in saturated (animal) fats and sweets and consisting of fresh fruits and vegetables, lean meats such as fish and white chicken and whole grains.  These are some of recommendations for screening depending of age and risk factors:  - Vaccines:  Tdap vaccine every 10 years.  Shingles vaccine recommended at age 74, could be given after 34 years of age but not sure about insurance coverage.   Pneumonia vaccines: Pneumovax at 38. Sometimes Pneumovax is giving earlier if history of smoking, lung disease,diabetes,kidney disease among some.  Screening for diabetes at age 59 and every 3 years.  Cervical cancer prevention:  Pap smear starts at 34 years of age and continues periodically until 34 years old in low risk women. Pap smear every 3 years between 72 and 18 years old. Pap smear every 3-5 years between women 29 and older if pap smear negative and HPV screening negative.   -Breast cancer: Mammogram: There is disagreement between experts about when to start  screening in low risk asymptomatic female but recent recommendations are to start screening at 81 and not later than 34 years old , every 1-2 years and after 34 yo q 2 years. Screening is recommended until 34 years old but some women can continue screening depending of healthy issues.  Colon cancer screening: Has been recently changed to 34 yo. Insurance may not cover until you are 34 years old. Screening is recommended until 34 years old.  Cholesterol disorder screening at age 73 and every 3 years.  Also recommended:  1. Dental visit- Brush and floss your teeth twice daily; visit your dentist twice a year. 2. Eye doctor- Get an eye exam at least every 2 years. 3. Helmet use- Always wear a helmet when riding a bicycle, motorcycle, rollerblading or skateboarding. 4. Safe sex- If you may be exposed to sexually transmitted infections, use a condom. 5. Seat belts- Seat belts can save your live; always wear one. 6. Smoke/Carbon Monoxide detectors- These detectors need to be installed on the appropriate level of your home. Replace batteries at least once a year. 7. Skin cancer- When out in the sun please cover up and use sunscreen 15 SPF or higher. 8. Violence- If anyone is threatening or hurting you, please tell your healthcare provider.  9. Drink alcohol in moderation- Limit alcohol intake to one drink or less per day. Never drink and drive. 10. Calcium supplementation 1000 to 1200 mg daily, ideally through your diet.  Vitamin D supplementation 800 units daily.

## 2020-09-19 NOTE — Progress Notes (Signed)
HPI: Stacy Hawkins is a 35 y.o. female, who is here today for her routine physical.  Last CPE: 08/01/18.  Regular exercise 3 or more time per week: Walking 4-5 times per week for about 30-60 min. Following a healthful diet: Trying to eat healthier, decreased sweets/sugar intake. She lives with her husband.  Chronic medical problems: Migraine headaches,GAD,and knee pain (has improved).  Pap smear: 2020. Follows with Dr Terri Piedra regularly. Since her last visit she underwent laparoscopic surgery. Dx'ed with endometriosis on the colon, planning on seeing gyn in [place] at the endometriosis clinic.  Immunization History  Administered Date(s) Administered  . HPV Quadrivalent 02/12/2005  . Hepatitis B 01/13/1991  . Influenza,inj,Quad PF,6+ Mos 08/01/2018, 02/08/2019  . Influenza-Unspecified 03/14/2013, 03/14/2014, 02/08/2019, 03/31/2020  . MMR 09/13/1987  . Meningococcal Conjugate 11/12/2004  . PPD Test 10/06/2016  . Tdap 04/15/2007, 08/01/2018   Mammogram: N/A Colonoscopy: N/A DEXA: N/A Hep C screening: Never.  She has no new concerns today.  Migraine headache 2-3 times per month. No new associated symptoms or changes in intensity/frequency. Right temporal and associated photophobia and nausea. No aura. She takes Imitrex daily prn.  Review of Systems  Constitutional: Negative for activity change, appetite change, fever and unexpected weight change.  HENT: Negative for hearing loss, mouth sores, sore throat, trouble swallowing and voice change.   Eyes: Negative for redness and visual disturbance.  Respiratory: Negative for cough, shortness of breath and wheezing.   Cardiovascular: Negative for chest pain and leg swelling.  Gastrointestinal: Negative for abdominal pain, nausea and vomiting.       No changes in bowel habits.  Endocrine: Negative for cold intolerance, heat intolerance, polydipsia, polyphagia and polyuria.  Genitourinary: Negative for  decreased urine volume, dysuria, hematuria, vaginal bleeding and vaginal discharge.  Musculoskeletal: Negative for gait problem and myalgias.  Skin: Negative for color change and rash.  Allergic/Immunologic: Positive for environmental allergies.  Neurological: Positive for headaches. Negative for syncope, facial asymmetry and weakness.  Hematological: Negative for adenopathy. Does not bruise/bleed easily.  Psychiatric/Behavioral: Negative for confusion.  All other systems reviewed and are negative.  Current Outpatient Medications on File Prior to Visit  Medication Sig Dispense Refill  . ibuprofen (ADVIL) 600 MG tablet Take 1 tablet (600 mg total) by mouth every 6 (six) hours as needed for moderate pain or cramping. 40 tablet 1  . Melatonin 5 MG TABS Take 5 mg by mouth at bedtime.    . Multiple Vitamins-Minerals (QC WOMENS DAILY MULTIVITAMIN PO) Women's Daily Multivitamin    . sertraline (ZOLOFT) 100 MG tablet Take 100 mg by mouth daily.      No current facility-administered medications on file prior to visit.   Past Medical History:  Diagnosis Date  . Arthritis   . Dyspareunia, female   . Eczema   . GAD (generalized anxiety disorder)   . GERD (gastroesophageal reflux disease)   . Migraines   . Ovarian cyst   . Ovarian cyst   . Postcoital bleeding   . Wears contact lenses     Past Surgical History:  Procedure Laterality Date  . CHROMOPERTUBATION Bilateral 05/15/2020   Procedure: CHROMOPERTUBATION;  Surgeon: Sherlyn Hay, DO;  Location: [facility];  Service: Gynecology;  Laterality: Bilateral;  . HYSTEROSCOPY WITH D & C  08-29-2007  @[facility]    AND POLYPECTOMY  . HYSTEROSCOPY WITH D & C N/A 05/15/2020   Procedure: DILATATION AND CURETTAGE /HYSTEROSCOPY;  Surgeon: Sherlyn Hay, DO;  Location: [facility];  Service: Gynecology;  Laterality: N/A;  . LAPAROSCOPIC OVARIAN CYSTECTOMY Bilateral 05/15/2020   Procedure: LAPAROSCOPIC LEFT OVARIAN  DRAINAGE AND EXCISION OF RIGHT ADNEXAL MASS;  Surgeon: Sherlyn Hay, DO;  Location: [facility];  Service: Gynecology;  Laterality: Bilateral;  . LAPAROSCOPY Bilateral 05/15/2020   Procedure: LAPAROSCOPY OPERATIVE;  Surgeon: Sherlyn Hay, DO;  Location: [facility];  Service: Gynecology;  Laterality: Bilateral;  . NASAL SEPTUM SURGERY  2006  . WISDOM TOOTH EXTRACTION  2006   No Known Allergies  Family History  Problem Relation Age of Onset  . Arthritis Mother   . Asthma Mother   . Depression Mother   . Miscarriages / [place] Mother   . Alcohol abuse Father   . Depression Father   . Diabetes Father   . Drug abuse Father   . Hyperlipidemia Father   . Hypertension Father   . Mental illness Father   . Arthritis Father   . Asthma Brother   . Depression Brother   . Drug abuse Brother   . Learning disabilities Brother   . Mental illness Brother   . Arthritis Maternal Grandmother   . Depression Maternal Grandmother   . Macular degeneration Maternal Grandmother   . Hearing loss Maternal Grandfather   . Heart disease Maternal Grandfather   . Heart attack Maternal Grandfather   . Arthritis Paternal Grandmother   . Heart disease Paternal Grandmother   . Dementia Paternal Grandmother   . Heart disease Paternal Grandfather   . Hyperlipidemia Paternal Grandfather   . Hypertension Paternal Grandfather   . Depression Paternal Grandfather   . Diabetes Paternal Grandfather     Social History   Socioeconomic History  . Marital status: Married    Spouse name: Not on file  . Number of children: Not on file  . Years of education: Not on file  . Highest education level: Not on file  Occupational History  . Not on file  Tobacco Use  . Smoking status: Never Smoker  . Smokeless tobacco: Never Used  Vaping Use  . Vaping Use: Never used  Substance and Sexual Activity  . Alcohol use: Yes    Comment: OCCASIONAL  . Drug use: Never  .  Sexual activity: Yes    Partners: Male    Birth control/protection: Diaphragm  Other Topics Concern  . Not on file  Social History Narrative  . Not on file   Social Determinants of Health   Financial Resource Strain: Not on file  Food Insecurity: Not on file  Transportation Needs: Not on file  Physical Activity: Not on file  Stress: Not on file  Social Connections: Not on file   Vitals:   09/19/20 0926  BP: 118/70  Pulse: 67  Resp: 12  SpO2: 97%   Body mass index is 39.77 kg/m.  Wt Readings from Last 3 Encounters:  09/19/20 239 lb (108.4 kg)  05/15/20 236 lb (107 kg)  08/01/18 245 lb 6 oz (111.3 kg)   Physical Exam Vitals and nursing note reviewed.  Constitutional:      General: She is not in acute distress.    Appearance: She is well-developed.  HENT:     Head: Normocephalic and atraumatic.     Right Ear: Hearing, tympanic membrane, ear canal and external ear normal.     Left Ear: Hearing, tympanic membrane, ear canal and external ear normal.     Mouth/Throat:     Mouth: Mucous membranes are moist.  Pharynx: Oropharynx is clear. Uvula midline.  Eyes:     Extraocular Movements: Extraocular movements intact.     Conjunctiva/sclera: Conjunctivae normal.     Pupils: Pupils are equal, round, and reactive to light.  Neck:     Thyroid: Thyromegaly (Mildly enlarged,symmetric.) present. No thyroid mass or thyroid tenderness.     Trachea: No tracheal deviation.  Cardiovascular:     Rate and Rhythm: Normal rate and regular rhythm.     Pulses:          Dorsalis pedis pulses are 2+ on the right side and 2+ on the left side.     Heart sounds: No murmur heard.   Pulmonary:     Effort: Pulmonary effort is normal. No respiratory distress.     Breath sounds: Normal breath sounds.  Chest:  Breasts:     Right: No supraclavicular adenopathy.     Left: No supraclavicular adenopathy.    Abdominal:     Palpations: Abdomen is soft. There is no hepatomegaly or mass.      Tenderness: There is no abdominal tenderness.  Genitourinary:    Comments: Deferred to gyn. Musculoskeletal:     Comments: No major deformity or signs of synovitis appreciated.  Lymphadenopathy:     Cervical: No cervical adenopathy.     Upper Body:     Right upper body: No supraclavicular adenopathy.     Left upper body: No supraclavicular adenopathy.  Skin:    General: Skin is warm.     Findings: No erythema or rash.  Neurological:     General: No focal deficit present.     Mental Status: She is alert and oriented to person, place, and time.     Cranial Nerves: No cranial nerve deficit.     Coordination: Coordination normal.     Gait: Gait normal.     Deep Tendon Reflexes:     Reflex Scores:      Bicep reflexes are 2+ on the right side and 2+ on the left side.      Patellar reflexes are 2+ on the right side and 2+ on the left side. Psychiatric:        Speech: Speech normal.     Comments: Well groomed, good eye contact.    ASSESSMENT AND PLAN:  Ms. Bettie Capistran was here today annual physical examination.  Orders Placed This Encounter  Procedures  . Basic metabolic panel  . Hemoglobin A1c  . TSH  . Lipid panel  . Hepatitis C antibody screen   Lab Results  Component Value Date   TSH 1.75 09/19/2020   Lab Results  Component Value Date   CREATININE 0.68 09/19/2020   BUN 9 09/19/2020   NA 137 09/19/2020   K 4.3 09/19/2020   CL 104 09/19/2020   CO2 28 09/19/2020   Lab Results  Component Value Date   CHOL 134 09/19/2020   HDL 42.90 09/19/2020   LDLCALC 72 09/19/2020   TRIG 93.0 09/19/2020   CHOLHDL 3 09/19/2020   Lab Results  Component Value Date   HGBA1C 5.0 09/19/2020   Routine general medical examination at a health care facility We discussed the importance of regular physical activity and healthy diet for prevention of chronic illness and/or complications. Preventive guidelines reviewed. Vaccination:Up to date. Continue female preventive  care with gyn.  Next CPE in a year.  Encounter for HCV screening test for low risk patient -     Hepatitis C antibody screen  Migraine without  aura and without status migrainosus, not intractable Stable. No changes in current management.  -     SUMAtriptan (IMITREX) 50 MG tablet; TAKE 1 TAB BY MOUTH DAILY AS NEEDED FOR MIGRAINE. MAY REPEAT IN 2 HOURS IF HEADACHE PERSISTS/RECURS.  Screening for lipoid disorders -     Lipid panel  Screening for endocrine, metabolic and immunity disorder -     Hemoglobin A1c -     Basic metabolic panel  Enlarged thyroid gland Mild and unchanged. She agrees with holding on imaging for now given the fact there is no mass/nodules Further recommendations according to TSH results.   Return in 1 year (on 09/19/2021) for cpe.  Alizaya Oshea G. Martin[place], MD  [facility]. [place] office.   Today you have you routine preventive visit. A few things to remember from today's visit:  Routine general medical examination at a health care facility  Encounter for HCV screening test for low risk patient - Plan: Hepatitis C antibody screen  Migraine without aura and without status migrainosus, not intractable - Plan: SUMAtriptan (IMITREX) 50 MG tablet, TSH  Screening for lipoid disorders - Plan: Lipid panel  Screening for endocrine, metabolic and immunity disorder - Plan: Basic metabolic panel, Hemoglobin A1c  Enlarged thyroid gland  If you need refills please call your pharmacy. Do not use My Chart to request refills or for acute issues that need immediate attention.   Please be sure medication list is accurate. If a new problem present, please set up appointment sooner than planned today.  At least 150 minutes of moderate exercise per week, daily brisk walking for 15-30 min is a good exercise option. Healthy diet low in saturated (animal) fats and sweets and consisting of fresh fruits and vegetables, lean meats such as fish and white chicken and  whole grains.  These are some of recommendations for screening depending of age and risk factors:  - Vaccines:  Tdap vaccine every 10 years.  Shingles vaccine recommended at age 60, could be given after 34 years of age but not sure about insurance coverage.   Pneumonia vaccines: Pneumovax at 48. Sometimes Pneumovax is giving earlier if history of smoking, lung disease,diabetes,kidney disease among some.  Screening for diabetes at age 35 and every 3 years.  Cervical cancer prevention:  Pap smear starts at 34 years of age and continues periodically until 34 years old in low risk women. Pap smear every 3 years between 20 and 88 years old. Pap smear every 3-5 years between women 52 and older if pap smear negative and HPV screening negative.   -Breast cancer: Mammogram: There is disagreement between experts about when to start screening in low risk asymptomatic female but recent recommendations are to start screening at 87 and not later than 34 years old , every 1-2 years and after 34 yo q 2 years. Screening is recommended until 34 years old but some women can continue screening depending of healthy issues.  Colon cancer screening: Has been recently changed to 34 yo. Insurance may not cover until you are 34 years old. Screening is recommended until 34 years old.  Cholesterol disorder screening at age 66 and every 3 years.  Also recommended:  1. Dental visit- Brush and floss your teeth twice daily; visit your dentist twice a year. 2. Eye doctor- Get an eye exam at least every 2 years. 3. Helmet use- Always wear a helmet when riding a bicycle, motorcycle, rollerblading or skateboarding. 4. Safe sex- If you may be exposed to sexually  transmitted infections, use a condom. 5. Seat belts- Seat belts can save your live; always wear one. 6. Smoke/Carbon Monoxide detectors- These detectors need to be installed on the appropriate level of your home. Replace batteries at least once a year. 7. Skin  cancer- When out in the sun please cover up and use sunscreen 15 SPF or higher. 8. Violence- If anyone is threatening or hurting you, please tell your healthcare provider.  9. Drink alcohol in moderation- Limit alcohol intake to one drink or less per day. Never drink and drive. 10. Calcium supplementation 1000 to 1200 mg daily, ideally through your diet.  Vitamin D supplementation 800 units daily.

## 2020-09-22 LAB — HEPATITIS C ANTIBODY
Hepatitis C Ab: NONREACTIVE
SIGNAL TO CUT-OFF: 0 (ref <1.00)

## 2020-11-24 ENCOUNTER — Other Ambulatory Visit: Payer: Self-pay | Admitting: Obstetrics and Gynecology

## 2020-11-24 DIAGNOSIS — IMO0002 Reserved for concepts with insufficient information to code with codable children: Secondary | ICD-10-CM

## 2020-12-04 ENCOUNTER — Ambulatory Visit
Admission: RE | Admit: 2020-12-04 | Discharge: 2020-12-04 | Disposition: A | Discharge status: Home / Self Care | Payer: 59 | Source: Ambulatory Visit | Attending: Obstetrics and Gynecology | Admitting: Obstetrics and Gynecology

## 2020-12-04 DIAGNOSIS — N803 Endometriosis of pelvic peritoneum: Secondary | ICD-10-CM

## 2020-12-04 DIAGNOSIS — IMO0002 Reserved for concepts with insufficient information to code with codable children: Secondary | ICD-10-CM

## 2021-04-14 ENCOUNTER — Encounter: Payer: Self-pay | Admitting: Family Medicine

## 2021-10-09 NOTE — Progress Notes (Signed)
? ?HPI: ?[name] is a 36 y.o. female, who is here today for her routine physical. ? ?Last CPE: 09/19/20 ? ?Regular exercise: She walks and hikes a few times per week, at least 4-5 for 60 min. ?Following a healthful diet: Her husband is vegetarian, so she does not eat meat/chicken/fish frequently, she acknowledges she may eat bigger carb portions. ? ?Chronic medical problems: Migraine headaches,GAD,and knee pain ? ?Immunization History  ?Administered Date(s) Administered  ? HPV Quadrivalent 02/12/2005  ? Hepatitis B 01/13/1991  ? Influenza,inj,Quad PF,6+ Mos 08/01/2018, 02/08/2019  ? Influenza-Unspecified 03/14/2013, 03/14/2014, 02/08/2019, 03/31/2020, 04/13/2021  ? MMR 09/13/1987  ? Meningococcal Conjugate 11/12/2004  ? PFIZER Comirnaty(Gray Top)Covid-19 Tri-Sucrose Vaccine 09/06/2019, 10/04/2019, 04/29/2020, 04/13/2021  ? PPD Test 10/06/2016  ? Tdap 04/15/2007, 08/01/2018  ? ?Health Maintenance  ?Topic Date Due  ? COVID-19 Vaccine (5 - Booster for [place] series) 10/28/2021 (Originally 06/08/2021)  ? HIV Screening  09/19/2025 (Originally 10/09/2001)  ? INFLUENZA VACCINE  01/12/2022  ? PAP SMEAR-Modifier  06/27/2023  ? TETANUS/TDAP  08/01/2028  ? Hepatitis C Screening  Completed  ? HPV VACCINES  Aged Out  ? ?Concerns today: ? ?Her insurance if not longer accepted at her psychiatrist's office. She has been on Sertraline 100 mg, started medication about 5 years ago. ?She is reporting symptoms as well controlled. ?She took Effexor before. ?Denies depressed mood. ? ?Migraine headaches: 1-2 episodes [er month. ?No new associated symptoms. ?Takes Imitrex as needed. ? ?Since her last visit she underwent laparoscopic procedure for endometriosis. She is trying to get pregnant. ?She is on prenatal vitamins. ? ?Today noted bradycardia, she is reporting he of HR in the 50's. ?Negative for CP,SOB,palpitations. ?Lab Results  ?Component Value Date  ? TSH 1.75 09/19/2020  ? ?Lab Results  ?Component Value Date  ?  CHOL 134 09/19/2020  ? HDL 42.90 09/19/2020  ? [place] 72 09/19/2020  ? TRIG 93.0 09/19/2020  ? CHOLHDL 3 09/19/2020  ? ?Review of Systems  ?Constitutional:  Negative for appetite change, fatigue and fever.  ?HENT:  Negative for hearing loss, mouth sores, sore throat, trouble swallowing and voice change.   ?Eyes:  Negative for redness and visual disturbance.  ?Respiratory:  Negative for cough, shortness of breath and wheezing.   ?Cardiovascular:  Negative for chest pain and leg swelling.  ?Gastrointestinal:  Negative for abdominal pain, nausea and vomiting.  ?     No changes in bowel habits.  ?Endocrine: Negative for cold intolerance, heat intolerance, polydipsia, polyphagia and polyuria.  ?Genitourinary:  Negative for decreased urine volume, dysuria, hematuria, vaginal bleeding and vaginal discharge.  ?Musculoskeletal:  Negative for gait problem and myalgias.  ?Skin:  Negative for color change and rash.  ?Allergic/Immunologic: Positive for environmental allergies.  ?Neurological:  Positive for headaches. Negative for syncope and weakness.  ?Hematological:  Negative for adenopathy. Does not bruise/bleed easily.  ?Psychiatric/Behavioral:  Negative for confusion and hallucinations.   ?All other systems reviewed and are negative. ? ?Current Outpatient Medications on File Prior to Visit  ?Medication Sig Dispense Refill  ? Melatonin 5 MG TABS Take 5 mg by mouth at bedtime.    ? Multiple Vitamins-Minerals (QC WOMENS DAILY MULTIVITAMIN PO) Women's Daily Multivitamin    ? SUMAtriptan (IMITREX) 50 MG tablet TAKE 1 TAB BY MOUTH DAILY AS NEEDED FOR MIGRAINE. MAY REPEAT IN 2 HOURS IF HEADACHE PERSISTS/RECURS. 10 tablet 3  ? ?No current facility-administered medications on file prior to visit.  ? ?Past Medical History:  ?Diagnosis Date  ? Arthritis   ?  Dyspareunia, female   ? Eczema   ? GAD (generalized anxiety disorder)   ? GERD (gastroesophageal reflux disease)   ? Migraines   ? Ovarian cyst   ? Ovarian cyst   ? Postcoital  bleeding   ? Wears contact lenses   ? ? ?Past Surgical History:  ?Procedure Laterality Date  ? CHROMOPERTUBATION Bilateral 05/15/2020  ? Procedure: CHROMOPERTUBATION;  Surgeon: Sherlyn Hay, DO;  Location: [facility];  Service: Gynecology;  Laterality: Bilateral;  ? HYSTEROSCOPY WITH D & C  08-29-2007  @[facility]   ? AND POLYPECTOMY  ? HYSTEROSCOPY WITH D & C N/A 05/15/2020  ? Procedure: DILATATION AND CURETTAGE /HYSTEROSCOPY;  Surgeon: Sherlyn Hay, DO;  Location: [place];  Service: Gynecology;  Laterality: N/A;  ? LAPAROSCOPIC OVARIAN CYSTECTOMY Bilateral 05/15/2020  ? Procedure: LAPAROSCOPIC LEFT OVARIAN DRAINAGE AND EXCISION OF RIGHT ADNEXAL MASS;  Surgeon: Sherlyn Hay, DO;  Location: [place];  Service: Gynecology;  Laterality: Bilateral;  ? LAPAROSCOPY Bilateral 05/15/2020  ? Procedure: LAPAROSCOPY OPERATIVE;  Surgeon: Sherlyn Hay, DO;  Location: [place];  Service: Gynecology;  Laterality: Bilateral;  ? NASAL SEPTUM SURGERY  2006  ? [place] EXTRACTION  2006  ? ? ?Allergies  ?Allergen Reactions  ? Sulfa Antibiotics Hives, Itching, Rash and Swelling  ? ? ?Family History  ?Problem Relation Age of Onset  ? Arthritis Mother   ? Asthma Mother   ? Depression Mother   ? Miscarriages / [place] Mother   ? Alcohol abuse Father   ? Depression Father   ? Diabetes Father   ? Drug abuse Father   ? Hyperlipidemia Father   ? Hypertension Father   ? Mental illness Father   ? Arthritis Father   ? Asthma Brother   ? Depression Brother   ? Drug abuse Brother   ? Learning disabilities Brother   ? Mental illness Brother   ? Arthritis Maternal Grandmother   ? Depression Maternal Grandmother   ? Macular degeneration Maternal Grandmother   ? Hearing loss Maternal Grandfather   ? Heart disease Maternal Grandfather   ? Heart attack Maternal Grandfather   ? Arthritis Paternal Grandmother   ? Heart disease Paternal Grandmother   ?  Dementia Paternal Grandmother   ? Heart disease Paternal Grandfather   ? Hyperlipidemia Paternal Grandfather   ? Hypertension Paternal Grandfather   ? Depression Paternal Grandfather   ? Diabetes Paternal Grandfather   ? ? ?Social History  ? ?Socioeconomic History  ? Marital status: Married  ?  Spouse name: Not on file  ? Number of children: Not on file  ? Years of education: Not on file  ? Highest education level: Not on file  ?Occupational History  ? Not on file  ?Tobacco Use  ? Smoking status: Never  ? Smokeless tobacco: Never  ?Vaping Use  ? Vaping Use: Never used  ?Substance and Sexual Activity  ? Alcohol use: Yes  ?  Comment: OCCASIONAL  ? Drug use: Never  ? Sexual activity: Yes  ?  Partners: Male  ?  Birth control/protection: Diaphragm  ?Other Topics Concern  ? Not on file  ?Social History Narrative  ? Not on file  ? ?Social Determinants of Health  ? ?Financial Resource Strain: Not on file  ?Food Insecurity: Not on file  ?Transportation Needs: Not on file  ?Physical Activity: Not on file  ?Stress: Not on file  ?Social Connections: Not on file  ? ? ?  Vitals:  ? 10/12/21 1447 10/12/21 1529  ?BP: 124/74   ?Pulse: 77 (!) 52  ?Resp: 12   ?SpO2: 99%   ? ?Body mass index is 40.98 kg/m?. ? ?Wt Readings from Last 3 Encounters:  ?10/12/21 246 lb 4 oz (111.7 kg)  ?09/19/20 239 lb (108.4 kg)  ?05/15/20 236 lb (107 kg)  ? ?Physical Exam ?Vitals and nursing note reviewed.  ?Constitutional:   ?   General: She is not in acute distress. ?   Appearance: She is well-developed.  ?HENT:  ?   Head: Normocephalic and atraumatic.  ?   Right Ear: Hearing, tympanic membrane, ear canal and external ear normal.  ?   Left Ear: Hearing, tympanic membrane, ear canal and external ear normal.  ?   Mouth/Throat:  ?   Mouth: Mucous membranes are moist.  ?   Pharynx: Oropharynx is clear. Uvula midline.  ?Eyes:  ?   Extraocular Movements: Extraocular movements intact.  ?   Conjunctiva/sclera: Conjunctivae normal.  ?   Pupils: Pupils are equal,  round, and reactive to light.  ?Neck:  ?   Thyroid: No thyroid mass.  ?   Trachea: No tracheal deviation.  ?Cardiovascular:  ?   Rate and Rhythm: Regular rhythm. Bradycardia present.  ?   Pulses:     ?     Dor

## 2021-10-12 ENCOUNTER — Encounter: Payer: Self-pay | Admitting: Family Medicine

## 2021-10-12 ENCOUNTER — Ambulatory Visit (INDEPENDENT_AMBULATORY_CARE_PROVIDER_SITE_OTHER): Payer: 59 | Admitting: Family Medicine

## 2021-10-12 VITALS — BP 124/74 | HR 52 | Resp 12 | Ht 65.0 in | Wt 246.2 lb

## 2021-10-12 DIAGNOSIS — F411 Generalized anxiety disorder: Secondary | ICD-10-CM

## 2021-10-12 DIAGNOSIS — Z13228 Encounter for screening for other metabolic disorders: Secondary | ICD-10-CM | POA: Diagnosis not present

## 2021-10-12 DIAGNOSIS — Z1329 Encounter for screening for other suspected endocrine disorder: Secondary | ICD-10-CM | POA: Diagnosis not present

## 2021-10-12 DIAGNOSIS — R001 Bradycardia, unspecified: Secondary | ICD-10-CM | POA: Insufficient documentation

## 2021-10-12 DIAGNOSIS — Z0000 Encounter for general adult medical examination without abnormal findings: Secondary | ICD-10-CM

## 2021-10-12 DIAGNOSIS — G43009 Migraine without aura, not intractable, without status migrainosus: Secondary | ICD-10-CM

## 2021-10-12 DIAGNOSIS — Z130 Encounter for screening for diseases of the blood and blood-forming organs and certain disorders involving the immune mechanism: Secondary | ICD-10-CM

## 2021-10-12 LAB — POCT GLYCOSYLATED HEMOGLOBIN (HGB A1C): Hemoglobin A1C: 4.8 % (ref 4.0–5.6)

## 2021-10-12 MED ORDER — SERTRALINE HCL 100 MG PO TABS: 100.0000 mg | ORAL_TABLET | Freq: Every day | ORAL | 2 refills | Status: DC

## 2021-10-12 NOTE — Assessment & Plan Note (Signed)
Otherwise stable. ?No changes in current management, because trying to get pregnant trying to avoid frequent use. ?

## 2021-10-12 NOTE — Patient Instructions (Addendum)
?A few things to remember from today's visit: ? ?Routine general medical examination at a health care facility ? ?Screening for endocrine, metabolic and immunity disorder - Plan: POC HgB A1c ? ?Sinus bradycardia ? ?GAD (generalized anxiety disorder) ? ?Migraine without aura and without status migrainosus, not intractable ? ?If you need refills please call your pharmacy. ?Do not use My Chart to request refills or for acute issues that need immediate attention. ?  ? ?Please be sure medication list is accurate. ?If a new problem present, please set up appointment sooner than planned today. ? ? ?Health Maintenance, Female ?Adopting a healthy lifestyle and getting preventive care are important in promoting health and wellness. Ask your health care provider about: ?The right schedule for you to have regular tests and exams. ?Things you can do on your own to prevent diseases and keep yourself healthy. ?What should I know about diet, weight, and exercise? ?Eat a healthy diet ? ?Eat a diet that includes plenty of vegetables, fruits, low-fat dairy products, and lean protein. ?Do not eat a lot of foods that are high in solid fats, added sugars, or sodium. ?Maintain a healthy weight ?Body mass index (BMI) is used to identify weight problems. It estimates body fat based on height and weight. Your health care provider can help determine your BMI and help you achieve or maintain a healthy weight. ?Get regular exercise ?Get regular exercise. This is one of the most important things you can do for your health. Most adults should: ?Exercise for at least 150 minutes each week. The exercise should increase your heart rate and make you sweat (moderate-intensity exercise). ?Do strengthening exercises at least twice a week. This is in addition to the moderate-intensity exercise. ?Spend less time sitting. Even light physical activity can be beneficial. ?Watch cholesterol and blood lipids ?Have your blood tested for lipids and cholesterol  at 35 years of age, then have this test every 5 years. ?Have your cholesterol levels checked more often if: ?Your lipid or cholesterol levels are high. ?You are older than 35 years of age. ?You are at high risk for heart disease. ?What should I know about cancer screening? ?Depending on your health history and family history, you may need to have cancer screening at various ages. This may include screening for: ?Breast cancer. ?Cervical cancer. ?Colorectal cancer. ?Skin cancer. ?Lung cancer. ?What should I know about heart disease, diabetes, and high blood pressure? ?Blood pressure and heart disease ?High blood pressure causes heart disease and increases the risk of stroke. This is more likely to develop in people who have high blood pressure readings or are overweight. ?Have your blood pressure checked: ?Every 3-5 years if you are 58-18 years of age. ?Every year if you are 84 years old or older. ?Diabetes ?Have regular diabetes screenings. This checks your fasting blood sugar level. Have the screening done: ?Once every three years after age 65 if you are at a normal weight and have a low risk for diabetes. ?More often and at a younger age if you are overweight or have a high risk for diabetes. ?What should I know about preventing infection? ?Hepatitis B ?If you have a higher risk for hepatitis B, you should be screened for this virus. Talk with your health care provider to find out if you are at risk for hepatitis B infection. ?Hepatitis C ?Testing is recommended for: ?Everyone born from [birth year] through 1964. ?Anyone with known risk factors for hepatitis C. ?Sexually transmitted infections (STIs) ?Get screened for STIs,  including gonorrhea and chlamydia, if: ?You are sexually active and are younger than 35 years of age. ?You are older than 35 years of age and your health care provider tells you that you are at risk for this type of infection. ?Your sexual activity has changed since you were last screened, and you are  at increased risk for chlamydia or gonorrhea. Ask your health care provider if you are at risk. ?Ask your health care provider about whether you are at high risk for HIV. Your health care provider may recommend a prescription medicine to help prevent HIV infection. If you choose to take medicine to prevent HIV, you should first get tested for HIV. You should then be tested every 3 months for as long as you are taking the medicine. ?Pregnancy ?If you are about to stop having your period (premenopausal) and you may become pregnant, seek counseling before you get pregnant. ?Take 400 to 800 micrograms (mcg) of folic acid every day if you become pregnant. ?Ask for birth control (contraception) if you want to prevent pregnancy. ?Osteoporosis and menopause ?Osteoporosis is a disease in which the bones lose minerals and strength with aging. This can result in bone fractures. If you are 55 years old or older, or if you are at risk for osteoporosis and fractures, ask your health care provider if you should: ?Be screened for bone loss. ?Take a calcium or vitamin D supplement to lower your risk of fractures. ?Be given hormone replacement therapy (HRT) to treat symptoms of menopause. ?Follow these instructions at home: ?Alcohol use ?Do not drink alcohol if: ?Your health care provider tells you not to drink. ?You are pregnant, may be pregnant, or are planning to become pregnant. ?If you drink alcohol: ?Limit how much you have to: ?0-1 drink a day. ?Know how much alcohol is in your drink. In the [place] one drink equals one 12 oz bottle of beer (355 mL), one 5 oz glass of wine (148 mL), or one 1? oz glass of hard liquor (44 mL). ?Lifestyle ?Do not use any products that contain nicotine or tobacco. These products include cigarettes, chewing tobacco, and vaping devices, such as e-cigarettes. If you need help quitting, ask your health care provider. ?Do not use street drugs. ?Do not share needles. ?Ask your health care provider for  help if you need support or information about quitting drugs. ?General instructions ?Schedule regular health, dental, and eye exams. ?Stay current with your vaccines. ?Tell your health care provider if: ?You often feel depressed. ?You have ever been abused or do not feel safe at home. ?Summary ?Adopting a healthy lifestyle and getting preventive care are important in promoting health and wellness. ?Follow your health care provider's instructions about healthy diet, exercising, and getting tested or screened for diseases. ?Follow your health care provider's instructions on monitoring your cholesterol and blood pressure. ?This information is not intended to replace advice given to you by your health care provider. Make sure you discuss any questions you have with your health care provider. ?Document Revised: 10/20/2020 Document Reviewed: 10/20/2020 ?Elsevier Patient Education ? [place]. ? ?

## 2021-10-12 NOTE — Assessment & Plan Note (Signed)
Reported as stable. ?Mild and asymptomatic. ?Continue monitoring HR regularly. ?Instructed about warning signs. ?

## 2021-10-12 NOTE — Assessment & Plan Note (Signed)
Gained about 6-7 Lb since her last CPE. ?Consistency with healthy diet and physical activity encouraged. ?

## 2021-10-12 NOTE — Assessment & Plan Note (Signed)
Symptoms are stable and well controlled. ?Continue Sertraline 100 mg daily. ?We discussed risk vs benefits of continuing medication in case she gets pregnant. She states that she also dicussed this with psychiatrist and she was told it was safe to continue. ?

## 2021-10-13 ENCOUNTER — Other Ambulatory Visit: Payer: 59

## 2021-12-24 ENCOUNTER — Other Ambulatory Visit: Payer: Self-pay | Admitting: Family Medicine

## 2021-12-24 DIAGNOSIS — G43009 Migraine without aura, not intractable, without status migrainosus: Secondary | ICD-10-CM

## 2022-04-14 ENCOUNTER — Encounter: Payer: Self-pay | Admitting: Family Medicine

## 2022-04-25 ENCOUNTER — Other Ambulatory Visit (INDEPENDENT_AMBULATORY_CARE_PROVIDER_SITE_OTHER): Payer: 59 | Admitting: Family Medicine

## 2022-04-25 DIAGNOSIS — Z8349 Family history of other endocrine, nutritional and metabolic diseases: Secondary | ICD-10-CM

## 2022-04-25 NOTE — Progress Notes (Unsigned)
Please see the MyChart message reply(ies) for my assessment and plan.  The patient gave consent for this Medical Advice Message and is aware that it may result in a bill to their insurance company as well as the possibility that this may result in a co-payment or deductible. They are an established patient, but are not seeking medical advice exclusively about a problem treated during an in person or video visit in the last 7 days. I did not recommend an in person or video visit within 7 days of my reply.  1. Family history of hemochromatosis - Hepatic function panel; Future - Iron and TIBC; Future - Hemochromatosis DNA-PCR(c282y,h63d); Future  I spent a total of 15 minutes cumulative time within 7 days through MyChart messaging Shayla Heming [place], MD

## 2022-04-27 ENCOUNTER — Other Ambulatory Visit (INDEPENDENT_AMBULATORY_CARE_PROVIDER_SITE_OTHER): Payer: 59

## 2022-04-27 DIAGNOSIS — Z8349 Family history of other endocrine, nutritional and metabolic diseases: Secondary | ICD-10-CM | POA: Diagnosis not present

## 2022-04-27 LAB — HEPATIC FUNCTION PANEL
ALT: 12 U/L (ref 0–35)
AST: 14 U/L (ref 0–37)
Albumin: 4.7 g/dL (ref 3.5–5.2)
Alkaline Phosphatase: 59 U/L (ref 39–117)
Bilirubin, Direct: 0.1 mg/dL (ref 0.0–0.3)
Total Bilirubin: 0.3 mg/dL (ref 0.2–1.2)
Total Protein: 7.5 g/dL (ref 6.0–8.3)

## 2022-04-28 LAB — IRON AND TIBC
Iron Saturation: 29 % (ref 15–55)
Iron: 69 ug/dL (ref 27–159)
Total Iron Binding Capacity: 242 ug/dL — ABNORMAL LOW (ref 250–450)
UIBC: 173 ug/dL (ref 131–425)

## 2022-05-06 LAB — HEMOCHROMATOSIS DNA-PCR(C282Y,H63D)

## 2022-08-05 ENCOUNTER — Other Ambulatory Visit: Payer: Self-pay | Admitting: Family Medicine

## 2022-08-05 DIAGNOSIS — F411 Generalized anxiety disorder: Secondary | ICD-10-CM

## 2022-09-11 ENCOUNTER — Other Ambulatory Visit: Payer: Self-pay | Admitting: Family Medicine

## 2022-09-11 DIAGNOSIS — G43009 Migraine without aura, not intractable, without status migrainosus: Secondary | ICD-10-CM

## 2023-01-05 NOTE — Progress Notes (Unsigned)
HPI: Stacy Hawkins is a 37 y.o. female with PMHx significant for migraine headaches, GAD, and anxiety here today for her routine physical.  Last CPE: 10/12/21  She exercises regularly, walking for 30 to 45 minutes about five times a week.  She follows a diet consisting of chicken, beans and tofu, and consumes vegetables daily. She consumes two portions of fruit daily and snacks on nuts and carrots. She sleeps for an average of seven to eight hours per night and consumes alcohol in moderation, with three to four drinks per week. She is a non-smoker.  Immunization History  Administered Date(s) Administered   HPV Quadrivalent 02/12/2005   Hepatitis B 01/13/1991   Hpv-Unspecified 06/15/1999   Influenza,inj,Quad PF,6+ Mos 08/01/2018, 02/08/2019   Influenza-Unspecified 03/14/2013, 03/14/2014, 02/08/2019, 03/31/2020, 04/13/2021   MMR 09/13/1987   Meningococcal Conjugate 11/12/2004   PFIZER Comirnaty(Gray Top)Covid-19 Tri-Sucrose Vaccine 09/06/2019, 10/04/2019, 04/29/2020, 04/13/2021   PPD Test 10/06/2016   Tdap 04/15/2007, 08/01/2018  Reports receiving  HPV vaccines as a teenager  Health Maintenance  Topic Date Due   COVID-19 Vaccine (5 - 2023-24 season) 01/23/2023 (Originally 02/12/2022)   HIV Screening  09/19/2025 (Originally 10/09/2001)   INFLUENZA VACCINE  01/13/2023   PAP SMEAR-Modifier  06/27/2023   DTaP/Tdap/Td (3 - Td or Tdap) 08/01/2028   HPV VACCINES  Completed   Hepatitis C Screening  Completed   Last visit with her gynecologist on 11/24/22 for ovarian cyst, endometriosis, PMS,and infertility. She iis currently taking progesterone injections treat ovarian cysts.  Anxiety: She is currently taking Sertraline 100 mg.  Migraine headaches: She takes Imitrex 50 mg as needed.  She experiences migraines three to four times a month, with associated nausea and light sensitivity, but reports that they are usually manageable within a couple of hours after taking Imitrex.  The headaches are typically located on the right fronto-temporal side. .    She has a family history of hemochromatosis. Genetic testing has been completed  Lab Results  Component Value Date   ALT 12 04/27/2022   AST 14 04/27/2022   ALKPHOS 59 04/27/2022   BILITOT 0.3 04/27/2022   Lab Results  Component Value Date   WBC 6.9 08/29/2007   HGB 14.9 08/29/2007   HCT 42.2 08/29/2007   MCV 91.9 08/29/2007   PLT 228 08/29/2007   Lab Results  Component Value Date   CHOL 134 09/19/2020   HDL 42.90 09/19/2020   LDLCALC 72 09/19/2020   TRIG 93.0 09/19/2020   CHOLHDL 3 09/19/2020   Lab Results  Component Value Date   HGBA1C 4.8 10/12/2021   HgA1C 5.0 in 03/2022 at her gyn's office.  Review of Systems  Constitutional:  Negative for activity change, appetite change and fever.  HENT:  Negative for hearing loss, mouth sores, sore throat and trouble swallowing.   Eyes:  Negative for redness and visual disturbance.  Respiratory:  Negative for cough, shortness of breath and wheezing.   Cardiovascular:  Negative for chest pain and leg swelling.  Gastrointestinal:  Negative for abdominal pain and vomiting.       No changes in bowel habits.  Endocrine: Negative for cold intolerance, heat intolerance, polydipsia, polyphagia and polyuria.  Genitourinary:  Negative for decreased urine volume, dysuria, hematuria, vaginal bleeding and vaginal discharge.  Musculoskeletal:  Negative for gait problem and myalgias.  Skin:  Negative for color change and rash.  Allergic/Immunologic: Negative for environmental allergies.  Neurological:  Positive for headaches. Negative for seizures, syncope and weakness.  Hematological:  Negative  for adenopathy. Does not bruise/bleed easily.  Psychiatric/Behavioral:  Negative for confusion.   All other systems reviewed and are negative.  Current Outpatient Medications on File Prior to Visit  Medication Sig Dispense Refill   Melatonin 5 MG TABS Take 5 mg by mouth  at bedtime.     Multiple Vitamins-Minerals (QC WOMENS DAILY MULTIVITAMIN PO) Women's Daily Multivitamin     No current facility-administered medications on file prior to visit.   Past Medical History:  Diagnosis Date   Arthritis    Dyspareunia, female    Eczema    GAD (generalized anxiety disorder)    GERD (gastroesophageal reflux disease)    Migraines    Ovarian cyst    Ovarian cyst    Postcoital bleeding    Wears contact lenses    Past Surgical History:  Procedure Laterality Date   CHROMOPERTUBATION Bilateral 05/15/2020   Procedure: CHROMOPERTUBATION;  Surgeon: Edwinna Areola, DO;  Location: [facility];  Service: Gynecology;  Laterality: Bilateral;   HYSTEROSCOPY WITH D & C  08-29-2007  @[facility]    AND POLYPECTOMY   HYSTEROSCOPY WITH D & C N/A 05/15/2020   Procedure: DILATATION AND CURETTAGE /HYSTEROSCOPY;  Surgeon: Edwinna Areola, DO;  Location: [facility];  Service: Gynecology;  Laterality: N/A;   LAPAROSCOPIC OVARIAN CYSTECTOMY Bilateral 05/15/2020   Procedure: LAPAROSCOPIC LEFT OVARIAN DRAINAGE AND EXCISION OF RIGHT ADNEXAL MASS;  Surgeon: Edwinna Areola, DO;  Location: [facility];  Service: Gynecology;  Laterality: Bilateral;   LAPAROSCOPY Bilateral 05/15/2020   Procedure: LAPAROSCOPY OPERATIVE;  Surgeon: Edwinna Areola, DO;  Location: [facility];  Service: Gynecology;  Laterality: Bilateral;   NASAL SEPTUM SURGERY  2006   WISDOM TOOTH EXTRACTION  2006    Allergies  Allergen Reactions   Sulfa Antibiotics Hives, Itching, Rash and Swelling    Family History  Problem Relation Age of Onset   Arthritis Mother    Asthma Mother    Depression Mother    Miscarriages / [place] Mother    Alcohol abuse Father    Depression Father    Diabetes Father    Drug abuse Father    Hyperlipidemia Father    Hypertension Father    Mental illness Father    Arthritis Father    Asthma Brother     Depression Brother    Drug abuse Brother    Learning disabilities Brother    Mental illness Brother    Arthritis Maternal Grandmother    Depression Maternal Grandmother    Macular degeneration Maternal Grandmother    Hearing loss Maternal Grandfather    Heart disease Maternal Grandfather    Heart attack Maternal Grandfather    Arthritis Paternal Grandmother    Heart disease Paternal Grandmother    Dementia Paternal Grandmother    Heart disease Paternal Grandfather    Hyperlipidemia Paternal Grandfather    Hypertension Paternal Grandfather    Depression Paternal Grandfather    Diabetes Paternal Grandfather     Social History   Socioeconomic History   Marital status: Married    Spouse name: Not on file   Number of children: Not on file   Years of education: Not on file   Highest education level: Not on file  Occupational History   Not on file  Tobacco Use   Smoking status: Never   Smokeless tobacco: Never  Vaping Use   Vaping status: Never Used  Substance and Sexual Activity   Alcohol use: Yes  Comment: OCCASIONAL   Drug use: Never   Sexual activity: Yes    Partners: Male    Birth control/protection: Diaphragm  Other Topics Concern   Not on file  Social History Narrative   Not on file   Social Determinants of Health   Financial Resource Strain: Low Risk  (07/25/2022)   Received from [facility], [facility]   Overall Financial Resource Strain (CARDIA)    Difficulty of Paying Living Expenses: Not hard at all  Food Insecurity: No Food Insecurity (07/25/2022)   Received from [facility], [facility]   Hunger Vital Sign    Worried About Running Out of Food in the Last Year: Never true    Ran Out of Food in the Last Year: Never true  Transportation Needs: No Transportation Needs (07/25/2022)   Received from [facility], [facility]   PRAPARE - Transportation    Lack of Transportation (Medical): No    Lack of Transportation (Non-Medical): No  Physical  Activity: Sufficiently Active (07/25/2022)   Received from [facility], [facility]   Exercise Vital Sign    Days of Exercise per Week: 5 days    Minutes of Exercise per Session: 30 min  Stress: No Stress Concern Present (07/25/2022)   Received from [facility], [facility] - Occupational Stress Questionnaire    Feeling of Stress : Only a little  Social Connections: Moderately Integrated (07/25/2022)   Received from [facility], [facility]   Social Network    How would you rate your social network (family, work, friends)?: Adequate participation with social networks   Vitals:   01/07/23 0854  BP: 128/80  Pulse: 60  Resp: 12  Temp: 98.5 F (36.9 C)  SpO2: 99%   Body mass index is 41.77 kg/m.  Wt Readings from Last 3 Encounters:  01/07/23 251 lb (113.9 kg)  10/12/21 246 lb 4 oz (111.7 kg)  09/19/20 239 lb (108.4 kg)   Physical Exam Vitals and nursing note reviewed.  Constitutional:      General: She is not in acute distress.    Appearance: She is well-developed.  HENT:     Head: Normocephalic and atraumatic.     Right Ear: Tympanic membrane, ear canal and external ear normal.     Left Ear: Tympanic membrane, ear canal and external ear normal.     Mouth/Throat:     Mouth: Mucous membranes are moist.     Pharynx: Oropharynx is clear. Uvula midline.  Eyes:     Extraocular Movements: Extraocular movements intact.     Conjunctiva/sclera: Conjunctivae normal.     Pupils: Pupils are equal, round, and reactive to light.  Neck:     Thyroid: No thyromegaly.     Trachea: No tracheal deviation.  Cardiovascular:     Rate and Rhythm: Normal rate and regular rhythm.     Pulses:          Dorsalis pedis pulses are 2+ on the right side and 2+ on the left side.     Heart sounds: No murmur heard. Pulmonary:     Effort: Pulmonary effort is normal. No respiratory distress.     Breath sounds: Normal breath sounds.  Abdominal:      Palpations: Abdomen is soft. There is no hepatomegaly or mass.     Tenderness: There is no abdominal tenderness.  Genitourinary:    Comments: Deferred to gyn. Musculoskeletal:     Comments: No major deformity or signs  of synovitis appreciated.  Lymphadenopathy:     Cervical: No cervical adenopathy.     Upper Body:     Right upper body: No supraclavicular adenopathy.     Left upper body: No supraclavicular adenopathy.  Skin:    General: Skin is warm.     Findings: No erythema or rash.  Neurological:     General: No focal deficit present.     Mental Status: She is alert and oriented to person, place, and time.     Cranial Nerves: No cranial nerve deficit.     Coordination: Coordination normal.     Gait: Gait normal.     Deep Tendon Reflexes:     Reflex Scores:      Bicep reflexes are 2+ on the right side and 2+ on the left side.      Patellar reflexes are 2+ on the right side and 2+ on the left side. Psychiatric:        Mood and Affect: Mood and affect normal.   ASSESSMENT AND PLAN:  Ms. Kateena Degroote was here today annual physical examination.  Orders Placed This Encounter  Procedures   CBC   Hepatic function panel   Iron   Lab Results  Component Value Date   WBC 7.1 01/07/2023   HGB 14.3 01/07/2023   HCT 43.3 01/07/2023   MCV 95.9 01/07/2023   PLT 287.0 01/07/2023   Lab Results  Component Value Date   ALT 18 01/07/2023   AST 16 01/07/2023   ALKPHOS 57 01/07/2023   BILITOT 0.4 01/07/2023   Routine general medical examination at a health care facility Assessment & Plan: We discussed the importance of regular physical activity and healthy diet for prevention of chronic illness and/or complications. Preventive guidelines reviewed. Vaccination up to date. Continue her female preventive care with her gynecologist. Next CPE in a year.   Family history of hemochromatosis Assessment & Plan: Will continue monitoring iron LFTs and CBC  annually.  Orders: -     CBC; Future -     Hepatic function panel; Future -     Iron; Future  GAD (generalized anxiety disorder) Assessment & Plan: Symptoms are stable and well controlled. Continue Sertraline 100 mg daily. We discussed general safety issues in case of pregnancy. She has discussed this with psychiatrist in the past.  Orders: -     Sertraline HCl; Take 1 tablet (100 mg total) by mouth daily.  Dispense: 90 tablet; Refill: 2  Migraine without aura and without status migrainosus, not intractable Assessment & Plan: She is still having episodes of migraines 2-3 times per month,not severe and Imitrex helps greatly, so no changes. She understands that if she gets a positive pregnancy test or suspects pregnancy, she needs to stop medication. Continue following annually, before if needed.  Orders: -     SUMAtriptan Succinate; TAKE 1 TAB DAILY AS NEEDED FOR MIGRAINE. MAY REPEAT IN 2 HOURS IF HEADACHE PERSISTS/RECURS.  Dispense: 10 tablet; Refill: 2  Obesity, morbid, BMI 40.0-49.9 (HCC)  She understands the benefits of wt loss as well as adverse effects of obesity. Consistency with healthy diet and physical activity encouraged.  Return in 1 year (on 01/07/2024) for CPE.  Pal Shell G. [place], MD  [facility]. [place] office.

## 2023-01-07 ENCOUNTER — Encounter: Payer: Self-pay | Admitting: Family Medicine

## 2023-01-07 ENCOUNTER — Ambulatory Visit (INDEPENDENT_AMBULATORY_CARE_PROVIDER_SITE_OTHER): Payer: 59 | Admitting: Family Medicine

## 2023-01-07 VITALS — BP 128/80 | HR 60 | Temp 98.5°F | Resp 12 | Ht 65.0 in | Wt 251.0 lb

## 2023-01-07 DIAGNOSIS — Z0000 Encounter for general adult medical examination without abnormal findings: Secondary | ICD-10-CM | POA: Diagnosis not present

## 2023-01-07 DIAGNOSIS — G43009 Migraine without aura, not intractable, without status migrainosus: Secondary | ICD-10-CM

## 2023-01-07 DIAGNOSIS — Z8349 Family history of other endocrine, nutritional and metabolic diseases: Secondary | ICD-10-CM | POA: Diagnosis not present

## 2023-01-07 DIAGNOSIS — F411 Generalized anxiety disorder: Secondary | ICD-10-CM | POA: Diagnosis not present

## 2023-01-07 LAB — CBC
HCT: 43.3 % (ref 36.0–46.0)
Hemoglobin: 14.3 g/dL (ref 12.0–15.0)
MCHC: 33 g/dL (ref 30.0–36.0)
MCV: 95.9 fl (ref 78.0–100.0)
Platelets: 287 10*3/uL (ref 150.0–400.0)
RBC: 4.52 Mil/uL (ref 3.87–5.11)
RDW: 12.6 % (ref 11.5–15.5)
WBC: 7.1 10*3/uL (ref 4.0–10.5)

## 2023-01-07 LAB — HEPATIC FUNCTION PANEL
ALT: 18 U/L (ref 0–35)
AST: 16 U/L (ref 0–37)
Albumin: 4.2 g/dL (ref 3.5–5.2)
Alkaline Phosphatase: 57 U/L (ref 39–117)
Bilirubin, Direct: 0.1 mg/dL (ref 0.0–0.3)
Total Bilirubin: 0.4 mg/dL (ref 0.2–1.2)
Total Protein: 6.7 g/dL (ref 6.0–8.3)

## 2023-01-07 LAB — IRON: Iron: 83 ug/dL (ref 42–145)

## 2023-01-07 MED ORDER — SUMATRIPTAN SUCCINATE 50 MG PO TABS: ORAL_TABLET | ORAL | 2 refills | Status: DC

## 2023-01-07 MED ORDER — SERTRALINE HCL 100 MG PO TABS: 100.0000 mg | ORAL_TABLET | Freq: Every day | ORAL | 2 refills | Status: DC

## 2023-01-07 NOTE — Assessment & Plan Note (Signed)
We discussed the importance of regular physical activity and healthy diet for prevention of chronic illness and/or complications. Preventive guidelines reviewed. Vaccination up-to-date. Continue her female preventive care with her gynecologist. Next CPE in a year.

## 2023-01-07 NOTE — Assessment & Plan Note (Signed)
She is still having episodes of migraines 2-3 times per month,not severe and Imitrex helps greatly, so no changes. She understands that if she gets a positive pregnancy test or suspects pregnancy, she needs to stop medication. Continue following annually, before if needed.

## 2023-01-07 NOTE — Assessment & Plan Note (Signed)
Symptoms are stable and well controlled. Continue Sertraline 100 mg daily. We discussed general safety issues in case of pregnancy. She has discussed this with psychiatrist in the past.

## 2023-01-07 NOTE — Assessment & Plan Note (Signed)
Will continue monitoring iron LFTs and CBC annually.

## 2023-01-07 NOTE — Patient Instructions (Addendum)
A few things to remember from today's visit:  Routine general medical examination at a health care facility  Family history of hemochromatosis - Plan: Iron, CBC, Hepatic function panel  GAD (generalized anxiety disorder) - Plan: sertraline (ZOLOFT) 100 MG tablet  Migraine without aura and without status migrainosus, not intractable - Plan: SUMAtriptan (IMITREX) 50 MG tablet  If you need refills for medications you take chronically, please call your pharmacy. Do not use My Chart to request refills or for acute issues that need immediate attention. If you send a my chart message, it may take a few days to be addressed, specially if I am not in the office.  Please be sure medication list is accurate. If a new problem present, please set up appointment sooner than planned today.  Health Maintenance, Female Adopting a healthy lifestyle and getting preventive care are important in promoting health and wellness. Ask your health care provider about: The right schedule for you to have regular tests and exams. Things you can do on your own to prevent diseases and keep yourself healthy. What should I know about diet, weight, and exercise? Eat a healthy diet  Eat a diet that includes plenty of vegetables, fruits, low-fat dairy products, and lean protein. Do not eat a lot of foods that are high in solid fats, added sugars, or sodium. Maintain a healthy weight Body mass index (BMI) is used to identify weight problems. It estimates body fat based on height and weight. Your health care provider can help determine your BMI and help you achieve or maintain a healthy weight. Get regular exercise Get regular exercise. This is one of the most important things you can do for your health. Most adults should: Exercise for at least 150 minutes each week. The exercise should increase your heart rate and make you sweat (moderate-intensity exercise). Do strengthening exercises at least twice a week. This is in  addition to the moderate-intensity exercise. Spend less time sitting. Even light physical activity can be beneficial. Watch cholesterol and blood lipids Have your blood tested for lipids and cholesterol at 36 years of age, then have this test every 5 years. Have your cholesterol levels checked more often if: Your lipid or cholesterol levels are high. You are older than 36 years of age. You are at high risk for heart disease. What should I know about cancer screening? Depending on your health history and family history, you may need to have cancer screening at various ages. This may include screening for: Breast cancer. Cervical cancer. Colorectal cancer. Skin cancer. Lung cancer. What should I know about heart disease, diabetes, and high blood pressure? Blood pressure and heart disease High blood pressure causes heart disease and increases the risk of stroke. This is more likely to develop in people who have high blood pressure readings or are overweight. Have your blood pressure checked: Every 3-5 years if you are 66-37 years of age. Every year if you are 65 years old or older. Diabetes Have regular diabetes screenings. This checks your fasting blood sugar level. Have the screening done: Once every three years after age 89 if you are at a normal weight and have a low risk for diabetes. More often and at a younger age if you are overweight or have a high risk for diabetes. What should I know about preventing infection? Hepatitis B If you have a higher risk for hepatitis B, you should be screened for this virus. Talk with your health care provider to find out if  you are at risk for hepatitis B infection. Hepatitis C Testing is recommended for: Everyone born from [birth year] through 1964. Anyone with known risk factors for hepatitis C. Sexually transmitted infections (STIs) Get screened for STIs, including gonorrhea and chlamydia, if: You are sexually active and are younger than 36 years of  age. You are older than 35 years of age and your health care provider tells you that you are at risk for this type of infection. Your sexual activity has changed since you were last screened, and you are at increased risk for chlamydia or gonorrhea. Ask your health care provider if you are at risk. Ask your health care provider about whether you are at high risk for HIV. Your health care provider may recommend a prescription medicine to help prevent HIV infection. If you choose to take medicine to prevent HIV, you should first get tested for HIV. You should then be tested every 3 months for as long as you are taking the medicine. Pregnancy If you are about to stop having your period (premenopausal) and you may become pregnant, seek counseling before you get pregnant. Take 400 to 800 micrograms (mcg) of folic acid every day if you become pregnant. Ask for birth control (contraception) if you want to prevent pregnancy. Osteoporosis and menopause Osteoporosis is a disease in which the bones lose minerals and strength with aging. This can result in bone fractures. If you are 25 years old or older, or if you are at risk for osteoporosis and fractures, ask your health care provider if you should: Be screened for bone loss. Take a calcium or vitamin D supplement to lower your risk of fractures. Be given hormone replacement therapy (HRT) to treat symptoms of menopause. Follow these instructions at home: Alcohol use Do not drink alcohol if: Your health care provider tells you not to drink. You are pregnant, may be pregnant, or are planning to become pregnant. If you drink alcohol: Limit how much you have to: 0-1 drink a day. Know how much alcohol is in your drink. In the [place] one drink equals one 12 oz bottle of beer (355 mL), one 5 oz glass of wine (148 mL), or one 1 oz glass of hard liquor (44 mL). Lifestyle Do not use any products that contain nicotine or tobacco. These products include  cigarettes, chewing tobacco, and vaping devices, such as e-cigarettes. If you need help quitting, ask your health care provider. Do not use street drugs. Do not share needles. Ask your health care provider for help if you need support or information about quitting drugs. General instructions Schedule regular health, dental, and eye exams. Stay current with your vaccines. Tell your health care provider if: You often feel depressed. You have ever been abused or do not feel safe at home. Summary Adopting a healthy lifestyle and getting preventive care are important in promoting health and wellness. Follow your health care provider's instructions about healthy diet, exercising, and getting tested or screened for diseases. Follow your health care provider's instructions on monitoring your cholesterol and blood pressure. This information is not intended to replace advice given to you by your health care provider. Make sure you discuss any questions you have with your health care provider. Document Revised: 10/20/2020 Document Reviewed: 10/20/2020 Elsevier Patient Education  2024 [employer].

## 2023-05-24 NOTE — Progress Notes (Signed)
ACUTE VISIT No chief complaint on file.  HPI: [name] is a 37 y.o. female with a PMHx significant for migraine headaches, GAD, and anxiety, who is here today for a pap smear before endometriosis surgery.   Review of Systems See other pertinent positives and negatives in HPI.  Current Outpatient Medications on File Prior to Visit  Medication Sig Dispense Refill   Melatonin 5 MG TABS Take 5 mg by mouth at bedtime.     Multiple Vitamins-Minerals (QC WOMENS DAILY MULTIVITAMIN PO) Women's Daily Multivitamin     sertraline (ZOLOFT) 100 MG tablet Take 1 tablet (100 mg total) by mouth daily. 90 tablet 2   SUMAtriptan (IMITREX) 50 MG tablet TAKE 1 TAB DAILY AS NEEDED FOR MIGRAINE. MAY REPEAT IN 2 HOURS IF HEADACHE PERSISTS/RECURS. 10 tablet 2   No current facility-administered medications on file prior to visit.    Past Medical History:  Diagnosis Date   Arthritis    Dyspareunia, female    Eczema    GAD (generalized anxiety disorder)    GERD (gastroesophageal reflux disease)    Migraines    Ovarian cyst    Ovarian cyst    Postcoital bleeding    Wears contact lenses    Allergies  Allergen Reactions   Sulfa Antibiotics Hives, Itching, Rash and Swelling    Social History   Socioeconomic History   Marital status: Married    Spouse name: Not on file   Number of children: Not on file   Years of education: Not on file   Highest education level: Some college, no degree  Occupational History   Not on file  Tobacco Use   Smoking status: Never   Smokeless tobacco: Never  Vaping Use   Vaping status: Never Used  Substance and Sexual Activity   Alcohol use: Yes    Comment: OCCASIONAL   Drug use: Never   Sexual activity: Yes    Partners: Male    Birth control/protection: Diaphragm  Other Topics Concern   Not on file  Social History Narrative   Not on file   Social Determinants of Health   Financial Resource Strain: Low Risk  (05/24/2023)   Overall  Financial Resource Strain (CARDIA)    Difficulty of Paying Living Expenses: Not hard at all  Food Insecurity: No Food Insecurity (05/24/2023)   Hunger Vital Sign    Worried About Running Out of Food in the Last Year: Never true    Ran Out of Food in the Last Year: Never true  Transportation Needs: No Transportation Needs (05/24/2023)   PRAPARE - Administrator, Civil Service (Medical): No    Lack of Transportation (Non-Medical): No  Physical Activity: Sufficiently Active (05/24/2023)   Exercise Vital Sign    Days of Exercise per Week: 5 days    Minutes of Exercise per Session: 30 min  Stress: No Stress Concern Present (05/24/2023)   [employer] of Occupational Health - Occupational Stress Questionnaire    Feeling of Stress : Only a little  Social Connections: Socially Integrated (05/24/2023)   Social Connection and Isolation Panel [NHANES]    Frequency of Communication with Friends and Family: More than three times a week    Frequency of Social Gatherings with Friends and Family: Twice a week    Attends Religious Services: More than 4 times per year    Active Member of [employer] or Organizations: Yes    Attends Banker Meetings: More than 4 times per year  Marital Status: Married    There were no vitals filed for this visit. There is no height or weight on file to calculate BMI.  Physical Exam  ASSESSMENT AND PLAN:  Ms. Stacy Hawkins was seen today for a pap smear before endometriosis surgery.   There are no diagnoses linked to this encounter.  No follow-ups on file.  I, Suanne Marker, acting as a scribe for Pa Tennant [place], MD., have documented all relevant documentation on the behalf of [name], MD, as directed by  Saleah Rishel [place], MD while in the presence of Shelda Truby [place], MD.   I, Suanne Marker, have reviewed all documentation for this visit. The documentation on 05/24/23 for the exam, diagnosis, procedures, and orders are all accurate and  complete.  Carrol Bondar G. [place], MD  [facility]. [place] office.  Discharge Instructions   None

## 2023-05-25 ENCOUNTER — Other Ambulatory Visit (HOSPITAL_COMMUNITY)
Admission: RE | Admit: 2023-05-25 | Discharge: 2023-05-25 | Disposition: A | Discharge status: Home / Self Care | Payer: 59 | Source: Ambulatory Visit | Attending: Family Medicine | Admitting: Family Medicine

## 2023-05-25 ENCOUNTER — Encounter: Payer: Self-pay | Admitting: Family Medicine

## 2023-05-25 ENCOUNTER — Ambulatory Visit: Payer: 59 | Admitting: Family Medicine

## 2023-05-25 VITALS — BP 126/70 | HR 60 | Temp 98.8°F | Resp 12 | Ht 65.0 in | Wt 258.0 lb

## 2023-05-25 DIAGNOSIS — N809 Endometriosis, unspecified: Secondary | ICD-10-CM

## 2023-05-25 DIAGNOSIS — Z124 Encounter for screening for malignant neoplasm of cervix: Secondary | ICD-10-CM

## 2023-05-25 NOTE — Patient Instructions (Addendum)
A few things to remember from today's visit:  Cervical cancer screening - Plan: PAP [[place]], CANCELED: PAP [[place]]  Rectal endometriosis  No changes today. Continue regular physical activity and a healthful diet.  Do not use My Chart to request refills or for acute issues that need immediate attention. If you send a my chart message, it may take a few days to be addressed, specially if I am not in the office.  Please be sure medication list is accurate. If a new problem present, please set up appointment sooner than planned today.

## 2023-05-26 LAB — CYTOLOGY - PAP
Comment: NEGATIVE
Diagnosis: NEGATIVE
High risk HPV: NEGATIVE

## 2023-07-20 ENCOUNTER — Other Ambulatory Visit: Payer: Self-pay | Admitting: Family Medicine

## 2023-07-20 DIAGNOSIS — G43009 Migraine without aura, not intractable, without status migrainosus: Secondary | ICD-10-CM

## 2023-10-30 ENCOUNTER — Other Ambulatory Visit: Payer: Self-pay | Admitting: Family Medicine

## 2023-10-30 DIAGNOSIS — F411 Generalized anxiety disorder: Secondary | ICD-10-CM

## 2023-12-20 ENCOUNTER — Encounter: Payer: Self-pay | Admitting: Family Medicine

## 2023-12-20 DIAGNOSIS — Z0000 Encounter for general adult medical examination without abnormal findings: Secondary | ICD-10-CM

## 2023-12-20 DIAGNOSIS — Z130 Encounter for screening for diseases of the blood and blood-forming organs and certain disorders involving the immune mechanism: Secondary | ICD-10-CM

## 2024-01-19 ENCOUNTER — Other Ambulatory Visit: Payer: Self-pay | Admitting: Family Medicine

## 2024-01-19 DIAGNOSIS — G43009 Migraine without aura, not intractable, without status migrainosus: Secondary | ICD-10-CM

## 2024-01-20 ENCOUNTER — Encounter: Admitting: Family Medicine

## 2024-02-29 ENCOUNTER — Encounter: Payer: Self-pay | Admitting: Family Medicine

## 2024-02-29 NOTE — Telephone Encounter (Signed)
 No further action needed.

## 2024-03-07 ENCOUNTER — Ambulatory Visit: Admitting: Family Medicine

## 2024-03-07 ENCOUNTER — Encounter: Payer: Self-pay | Admitting: Family Medicine

## 2024-03-07 VITALS — BP 120/90 | HR 83 | Temp 98.0°F | Resp 16 | Ht 65.35 in | Wt 258.6 lb

## 2024-03-07 DIAGNOSIS — Z0000 Encounter for general adult medical examination without abnormal findings: Secondary | ICD-10-CM | POA: Diagnosis not present

## 2024-03-07 DIAGNOSIS — Z1329 Encounter for screening for other suspected endocrine disorder: Secondary | ICD-10-CM

## 2024-03-07 DIAGNOSIS — G43009 Migraine without aura, not intractable, without status migrainosus: Secondary | ICD-10-CM

## 2024-03-07 DIAGNOSIS — F411 Generalized anxiety disorder: Secondary | ICD-10-CM | POA: Diagnosis not present

## 2024-03-07 DIAGNOSIS — Z130 Encounter for screening for diseases of the blood and blood-forming organs and certain disorders involving the immune mechanism: Secondary | ICD-10-CM | POA: Diagnosis not present

## 2024-03-07 DIAGNOSIS — Z13228 Encounter for screening for other metabolic disorders: Secondary | ICD-10-CM

## 2024-03-07 DIAGNOSIS — Z13220 Encounter for screening for lipoid disorders: Secondary | ICD-10-CM | POA: Diagnosis not present

## 2024-03-07 NOTE — Progress Notes (Signed)
 Chief Complaint  Patient presents with   Annual Exam   Discussed the use of AI scribe software for clinical note transcription with the patient, who gave verbal consent to proceed.  History of Present Illness Stacy Hawkins is a 38 year old female who presents for an annual physical exam. Last CPE 12/18/22.  She engages in regular physical activity, walking four to five times per week for 30 minutes at a brisk pace.  Her diet is primarily home-cooked with daily vegetable intake.  Alcohol consumption is three to four drinks per week, consisting of wine and beer.  No smoking. She snacks on fruits, vegetables, and occasionally chips.  Her last menstrual period was on February 18, 2024. She sees a gynecologist regularly, with her last Pap smear conducted in December 2023. She is currently taking progesterone for fertility purposes and is trying to get pregnant.  She is on sertraline  100 mg daily for anxiety, which remains effective. She experiences migraines approximately twice a month, for which she takes sumatriptan  50 mg daily prn as needed.  Her sleep averages seven to eight hours per night. She wears contacts and sees an eye care provider regularly, as well as a dentist.  Immunization History  Administered Date(s) Administered   HPV Quadrivalent 02/12/2005   Hepatitis B 01/13/1991   Hpv-Unspecified 06/15/1999   Influenza,inj,Quad PF,6+ Mos 08/01/2018, 02/08/2019   Influenza-Unspecified 03/14/2013, 03/14/2014, 02/08/2019, 03/31/2020, 04/13/2021   MMR 09/13/1987   Meningococcal Conjugate 11/12/2004   PFIZER Comirnaty(Gray Top)Covid-19 Tri-Sucrose Vaccine 09/06/2019, 10/04/2019, 04/29/2020, 04/13/2021   PPD Test 10/06/2016   Tdap 04/15/2007, 08/01/2018   Health Maintenance  Topic Date Due   Hepatitis B Vaccines 19-59 Average Risk (2 of 3 - 3-dose series) 02/10/1991   Influenza Vaccine  01/13/2024   COVID-19 Vaccine (5 - 2025-26 season) 02/13/2024   HIV Screening   09/19/2025 (Originally 10/09/2001)   Cervical Cancer Screening (HPV/Pap Cotest)  05/24/2028   DTaP/Tdap/Td (3 - Td or Tdap) 08/01/2028   HPV VACCINES  Completed   Hepatitis C Screening  Completed   Pneumococcal Vaccine  Aged Out   Meningococcal B Vaccine  Aged Out   She has future blood work placed by request, has not been done. BP mildly elevated today, no hx of HTN.  She has no concerns today.  Review of Systems  Constitutional:  Negative for activity change, appetite change and fever.  HENT:  Negative for mouth sores, sore throat and trouble swallowing.   Eyes:  Negative for redness and visual disturbance.  Respiratory:  Negative for cough, shortness of breath and wheezing.   Cardiovascular:  Negative for chest pain, palpitations and leg swelling.  Gastrointestinal:  Negative for abdominal pain and vomiting.  Endocrine: Negative for cold intolerance, heat intolerance, polydipsia, polyphagia and polyuria.  Genitourinary:  Negative for decreased urine volume, dysuria and hematuria.  Musculoskeletal:  Negative for gait problem and myalgias.  Skin:  Negative for color change and rash.  Allergic/Immunologic: Positive for environmental allergies.  Neurological:  Positive for headaches (no more than usual). Negative for seizures, syncope and weakness.  Hematological:  Negative for adenopathy. Does not bruise/bleed easily.  Psychiatric/Behavioral:  Negative for confusion and hallucinations.   All other systems reviewed and are negative.  Current Outpatient Medications on File Prior to Visit  Medication Sig Dispense Refill   Multiple Vitamins-Minerals (QC WOMENS DAILY MULTIVITAMIN PO) Women's Daily Multivitamin     progesterone (PROMETRIUM) 200 MG capsule PLACE ONE CAPSULE HIGH IN THE VAGINA AT BEDTIME (PEAK+3-12)  progesterone 50 MG/ML injection Inject 4 mls three times a week.     sertraline  (ZOLOFT ) 100 MG tablet TAKE 1 TABLET BY MOUTH EVERY DAY 90 tablet 2   SUMAtriptan  (IMITREX )  50 MG tablet TAKE 1 TAB DAILY AS NEEDED FOR MIGRAINE. MAY REPEAT IN 2 HOURS IF HEADACHE PERSISTS/RECURS. 9 tablet 2   No current facility-administered medications on file prior to visit.    Past Medical History:  Diagnosis Date   Arthritis    Dyspareunia, female    Eczema    GAD (generalized anxiety disorder)    GERD (gastroesophageal reflux disease)    Migraines    Ovarian cyst    Ovarian cyst    Postcoital bleeding    Wears contact lenses     Past Surgical History:  Procedure Laterality Date   CHROMOPERTUBATION Bilateral 05/15/2020   Procedure: CHROMOPERTUBATION;  Surgeon: Delana Ted Morrison, DO;  Location: [facility];  Service: Gynecology;  Laterality: Bilateral;   HYSTEROSCOPY WITH D & C  08-29-2007  @[facility]    AND POLYPECTOMY   HYSTEROSCOPY WITH D & C N/A 05/15/2020   Procedure: DILATATION AND CURETTAGE /HYSTEROSCOPY;  Surgeon: Delana Ted Morrison, DO;  Location: [facility];  Service: Gynecology;  Laterality: N/A;   LAPAROSCOPIC OVARIAN CYSTECTOMY Bilateral 05/15/2020   Procedure: LAPAROSCOPIC LEFT OVARIAN DRAINAGE AND EXCISION OF RIGHT ADNEXAL MASS;  Surgeon: Delana Ted Morrison, DO;  Location: [facility];  Service: Gynecology;  Laterality: Bilateral;   LAPAROSCOPY Bilateral 05/15/2020   Procedure: LAPAROSCOPY OPERATIVE;  Surgeon: Delana Ted Morrison, DO;  Location: [facility];  Service: Gynecology;  Laterality: Bilateral;   NASAL SEPTUM SURGERY  2006   WISDOM TOOTH EXTRACTION  2006    Allergies  Allergen Reactions   Sulfa Antibiotics Hives, Itching, Rash and Swelling    Family History  Problem Relation Age of Onset   Arthritis Mother    Asthma Mother    Depression Mother    Miscarriages / [place] Mother    Alcohol abuse Father    Depression Father    Diabetes Father    Drug abuse Father    Hyperlipidemia Father    Hypertension Father    Mental illness Father    Arthritis Father    Asthma  Brother    Depression Brother    Drug abuse Brother    Learning disabilities Brother    Mental illness Brother    Arthritis Maternal Grandmother    Depression Maternal Grandmother    Macular degeneration Maternal Grandmother    Hearing loss Maternal Grandfather    Heart disease Maternal Grandfather    Heart attack Maternal Grandfather    Arthritis Paternal Grandmother    Heart disease Paternal Grandmother    Dementia Paternal Grandmother    Heart disease Paternal Grandfather    Hyperlipidemia Paternal Grandfather    Hypertension Paternal Grandfather    Depression Paternal Grandfather    Diabetes Paternal Grandfather     Social History   Socioeconomic History   Marital status: Married    Spouse name: Not on file   Number of children: Not on file   Years of education: Not on file   Highest education level: Some college, no degree  Occupational History   Not on file  Tobacco Use   Smoking status: Never   Smokeless tobacco: Never  Vaping Use   Vaping status: Never Used  Substance and Sexual Activity   Alcohol use: Yes    Comment: OCCASIONAL  Drug use: Never   Sexual activity: Yes    Partners: Male    Birth control/protection: Diaphragm  Other Topics Concern   Not on file  Social History Narrative   Not on file   Social Drivers of Health   Financial Resource Strain: Low Risk  (03/03/2024)   Overall Financial Resource Strain (CARDIA)    Difficulty of Paying Living Expenses: Not very hard  Food Insecurity: No Food Insecurity (03/03/2024)   Hunger Vital Sign    Worried About Running Out of Food in the Last Year: Never true    Ran Out of Food in the Last Year: Never true  Transportation Needs: No Transportation Needs (03/03/2024)   PRAPARE - Administrator, Civil Service (Medical): No    Lack of Transportation (Non-Medical): No  Physical Activity: Sufficiently Active (03/03/2024)   Exercise Vital Sign    Days of Exercise per Week: 5 days    Minutes of  Exercise per Session: 30 min  Stress: No Stress Concern Present (03/03/2024)   [employer] of Occupational Health - Occupational Stress Questionnaire    Feeling of Stress: Only a little  Social Connections: Socially Integrated (03/03/2024)   Social Connection and Isolation Panel    Frequency of Communication with Friends and Family: More than three times a week    Frequency of Social Gatherings with Friends and Family: Twice a week    Attends Religious Services: More than 4 times per year    Active Member of [employer] or Organizations: Yes    Attends Banker Meetings: More than 4 times per year    Marital Status: Married   Vitals:   03/07/24 1455  BP: (!) 120/90  Pulse: 83  Resp: 16  Temp: 98 F (36.7 C)  SpO2: 99%   Body mass index is 42.57 kg/m.  Wt Readings from Last 3 Encounters:  03/07/24 258 lb 9.6 oz (117.3 kg)  05/25/23 258 lb (117 kg)  01/07/23 251 lb (113.9 kg)   Physical Exam Vitals and nursing note reviewed.  Constitutional:      General: She is not in acute distress.    Appearance: She is well-developed.  HENT:     Head: Normocephalic and atraumatic.     Right Ear: Tympanic membrane, ear canal and external ear normal.     Left Ear: Tympanic membrane, ear canal and external ear normal.     Mouth/Throat:     Mouth: Mucous membranes are moist.     Pharynx: Oropharynx is clear. No oropharyngeal exudate.  Eyes:     Extraocular Movements: Extraocular movements intact.     Conjunctiva/sclera: Conjunctivae normal.     Pupils: Pupils are equal, round, and reactive to light.  Neck:     Thyroid : No thyroid  mass or thyromegaly (palpable).  Cardiovascular:     Rate and Rhythm: Normal rate and regular rhythm.     Pulses:          Dorsalis pedis pulses are 2+ on the right side and 2+ on the left side.     Heart sounds: No murmur heard. Pulmonary:     Effort: Pulmonary effort is normal. No respiratory distress.     Breath sounds: Normal breath sounds.   Abdominal:     Palpations: Abdomen is soft. There is no hepatomegaly or mass.     Tenderness: There is no abdominal tenderness.  Genitourinary:    Comments: Deferred to gyn. Musculoskeletal:     Comments: No major deformity or  signs of synovitis appreciated.  Lymphadenopathy:     Cervical: No cervical adenopathy.     Upper Body:     Right upper body: No supraclavicular adenopathy.     Left upper body: No supraclavicular adenopathy.  Skin:    General: Skin is warm.     Findings: No erythema or rash.  Neurological:     General: No focal deficit present.     Mental Status: She is alert and oriented to person, place, and time.     Cranial Nerves: No cranial nerve deficit.     Coordination: Coordination normal.     Gait: Gait normal.     Deep Tendon Reflexes:     Reflex Scores:      Bicep reflexes are 2+ on the right side and 2+ on the left side.      Patellar reflexes are 2+ on the right side and 2+ on the left side. Psychiatric:        Mood and Affect: Mood and affect normal.    ASSESSMENT AND PLAN:  Ms. Abri Vacca was here today annual physical examination. Lab Results  Component Value Date   HGBA1C 5.3 03/07/2024   Lab Results  Component Value Date   TSH 1.07 03/07/2024   Lab Results  Component Value Date   NA 138 03/07/2024   CL 104 03/07/2024   K 4.2 03/07/2024   CO2 25 03/07/2024   BUN 8 03/07/2024   CREATININE 0.90 03/07/2024   GFR 81.73 03/07/2024   CALCIUM 9.4 03/07/2024   ALBUMIN 4.2 01/07/2023   GLUCOSE 101 (H) 03/07/2024   Lab Results  Component Value Date   CHOL 150 03/07/2024   HDL 39.40 03/07/2024   LDLCALC 80 03/07/2024   TRIG 153.0 (H) 03/07/2024   CHOLHDL 4 03/07/2024   Routine general medical examination at a health care facility Assessment & Plan: We discussed the importance of regular physical activity and healthy diet for prevention of chronic illness and/or complications. Preventive guidelines reviewed. Vaccination  up to date. Continue her female preventive care with gynecologist. On daily prenatal vit. Monitor BP at home regularly. Next CPE in a year.  Orders: -     Hemoglobin A1c -     TSH -     Basic metabolic panel with GFR -     Lipid panel  Migraine without aura and without status migrainosus, not intractable Assessment & Plan: Stable, no new associated symptoms. Continue Imitrex  50 mg daily prn and discontinue if pregnancy is suspected. Continue following annually, before if needed.   GAD (generalized anxiety disorder) Assessment & Plan: Well controlled with Sertraline  100 mg daily.  Continue treatment, states that her obgyn is aware of her being on Sertraline . Continue annual follow ups as far as problem is stable.   Screening for endocrine, metabolic, and immunity disorder -     Hemoglobin A1c -     Basic metabolic panel with GFR  Obesity, morbid, BMI 40.0-49.9 (HCC) Assessment & Plan: Stable wt. Patient understands the benefits of wt loss as well as adverse effects of obesity. Consistency with healthy diet and physical activity encouraged. Stopping alcohol intake may help.   Orders: -     TSH -     Lipid panel  Lipid screening -     Lipid panel  Return in 1 year (on 03/07/2025).  Myrta Mercer G. [place], MD  [facility]. [place] office.

## 2024-03-07 NOTE — Patient Instructions (Addendum)
 A few things to remember from today's visit:  Routine general medical examination at a health care facility  Migraine without aura and without status migrainosus, not intractable  GAD (generalized anxiety disorder)  If you need refills for medications you take chronically, please call your pharmacy. Do not use My Chart to request refills or for acute issues that need immediate attention. If you send a my chart message, it may take a few days to be addressed, specially if I am not in the office.  Please be sure medication list is accurate. If a new problem present, please set up appointment sooner than planned today.  Health Maintenance, Female Adopting a healthy lifestyle and getting preventive care are important in promoting health and wellness. Ask your health care provider about: The right schedule for you to have regular tests and exams. Things you can do on your own to prevent diseases and keep yourself healthy. What should I know about diet, weight, and exercise? Eat a healthy diet  Eat a diet that includes plenty of vegetables, fruits, low-fat dairy products, and lean protein. Do not eat a lot of foods that are high in solid fats, added sugars, or sodium. Maintain a healthy weight Body mass index (BMI) is used to identify weight problems. It estimates body fat based on height and weight. Your health care provider can help determine your BMI and help you achieve or maintain a healthy weight. Get regular exercise Get regular exercise. This is one of the most important things you can do for your health. Most adults should: Exercise for at least 150 minutes each week. The exercise should increase your heart rate and make you sweat (moderate-intensity exercise). Do strengthening exercises at least twice a week. This is in addition to the moderate-intensity exercise. Spend less time sitting. Even light physical activity can be beneficial. Watch cholesterol and blood lipids Have your  blood tested for lipids and cholesterol at 37 years of age, then have this test every 5 years. Have your cholesterol levels checked more often if: Your lipid or cholesterol levels are high. You are older than 37 years of age. You are at high risk for heart disease. What should I know about cancer screening? Depending on your health history and family history, you may need to have cancer screening at various ages. This may include screening for: Breast cancer. Cervical cancer. Colorectal cancer. Skin cancer. Lung cancer. What should I know about heart disease, diabetes, and high blood pressure? Blood pressure and heart disease High blood pressure causes heart disease and increases the risk of stroke. This is more likely to develop in people who have high blood pressure readings or are overweight. Have your blood pressure checked: Every 3-5 years if you are 84-96 years of age. Every year if you are 22 years old or older. Diabetes Have regular diabetes screenings. This checks your fasting blood sugar level. Have the screening done: Once every three years after age 50 if you are at a normal weight and have a low risk for diabetes. More often and at a younger age if you are overweight or have a high risk for diabetes. What should I know about preventing infection? Hepatitis B If you have a higher risk for hepatitis B, you should be screened for this virus. Talk with your health care provider to find out if you are at risk for hepatitis B infection. Hepatitis C Testing is recommended for: Everyone born from [birth year] through 1964. Anyone with known risk factors for  hepatitis C. Sexually transmitted infections (STIs) Get screened for STIs, including gonorrhea and chlamydia, if: You are sexually active and are younger than 37 years of age. You are older than 37 years of age and your health care provider tells you that you are at risk for this type of infection. Your sexual activity has changed  since you were last screened, and you are at increased risk for chlamydia or gonorrhea. Ask your health care provider if you are at risk. Ask your health care provider about whether you are at high risk for HIV. Your health care provider may recommend a prescription medicine to help prevent HIV infection. If you choose to take medicine to prevent HIV, you should first get tested for HIV. You should then be tested every 3 months for as long as you are taking the medicine. Pregnancy If you are about to stop having your period (premenopausal) and you may become pregnant, seek counseling before you get pregnant. Take 400 to 800 micrograms (mcg) of folic acid every day if you become pregnant. Ask for birth control (contraception) if you want to prevent pregnancy. Osteoporosis and menopause Osteoporosis is a disease in which the bones lose minerals and strength with aging. This can result in bone fractures. If you are 6 years old or older, or if you are at risk for osteoporosis and fractures, ask your health care provider if you should: Be screened for bone loss. Take a calcium or vitamin D supplement to lower your risk of fractures. Be given hormone replacement therapy (HRT) to treat symptoms of menopause. Follow these instructions at home: Alcohol use Do not drink alcohol if: Your health care provider tells you not to drink. You are pregnant, may be pregnant, or are planning to become pregnant. If you drink alcohol: Limit how much you have to: 0-1 drink a day. Know how much alcohol is in your drink. In the [place] one drink equals one 12 oz bottle of beer (355 mL), one 5 oz glass of wine (148 mL), or one 1 oz glass of hard liquor (44 mL). Lifestyle Do not use any products that contain nicotine or tobacco. These products include cigarettes, chewing tobacco, and vaping devices, such as e-cigarettes. If you need help quitting, ask your health care provider. Do not use street drugs. Do not share  needles. Ask your health care provider for help if you need support or information about quitting drugs. General instructions Schedule regular health, dental, and eye exams. Stay current with your vaccines. Tell your health care provider if: You often feel depressed. You have ever been abused or do not feel safe at home. Summary Adopting a healthy lifestyle and getting preventive care are important in promoting health and wellness. Follow your health care provider's instructions about healthy diet, exercising, and getting tested or screened for diseases. Follow your health care provider's instructions on monitoring your cholesterol and blood pressure. This information is not intended to replace advice given to you by your health care provider. Make sure you discuss any questions you have with your health care provider. Document Revised: 10/20/2020 Document Reviewed: 10/20/2020 [employer]vier Patient Education  2024 [employer].

## 2024-03-08 ENCOUNTER — Encounter: Payer: Self-pay | Admitting: Family Medicine

## 2024-03-08 LAB — TSH: TSH: 1.07 u[IU]/mL (ref 0.35–5.50)

## 2024-03-08 LAB — BASIC METABOLIC PANEL WITH GFR
BUN: 8 mg/dL (ref 6–23)
CO2: 25 meq/L (ref 19–32)
Calcium: 9.4 mg/dL (ref 8.4–10.5)
Chloride: 104 meq/L (ref 96–112)
Creatinine, Ser: 0.9 mg/dL (ref 0.40–1.20)
GFR: 81.73 mL/min (ref >60.00)
Glucose, Bld: 101 mg/dL — ABNORMAL HIGH (ref 70–99)
Potassium: 4.2 meq/L (ref 3.5–5.1)
Sodium: 138 meq/L (ref 135–145)

## 2024-03-08 LAB — LIPID PANEL
Cholesterol: 150 mg/dL (ref 0–200)
HDL: 39.4 mg/dL (ref >39.00)
LDL Cholesterol: 80 mg/dL (ref 0–99)
NonHDL: 110.46
Total CHOL/HDL Ratio: 4
Triglycerides: 153 mg/dL — ABNORMAL HIGH (ref 0.0–149.0)
VLDL: 30.6 mg/dL (ref 0.0–40.0)

## 2024-03-08 LAB — HEMOGLOBIN A1C: Hgb A1c MFr Bld: 5.3 % (ref 4.6–6.5)

## 2024-03-08 NOTE — Assessment & Plan Note (Signed)
 Stable wt. Patient understands the benefits of wt loss as well as adverse effects of obesity. Consistency with healthy diet and physical activity encouraged. Stopping alcohol intake may help.

## 2024-03-08 NOTE — Assessment & Plan Note (Addendum)
 We discussed the importance of regular physical activity and healthy diet for prevention of chronic illness and/or complications. Preventive guidelines reviewed. Vaccination up to date. Continue her female preventive care with gynecologist. On daily prenatal vit. Monitor BP at home regularly. Next CPE in a year.

## 2024-03-08 NOTE — Assessment & Plan Note (Signed)
 Well controlled with Sertraline  100 mg daily.  Continue treatment, states that her obgyn is aware of her being on Sertraline . Continue annual follow ups as far as problem is stable.

## 2024-03-08 NOTE — Assessment & Plan Note (Signed)
 Stable, no new associated symptoms. Continue Imitrex  50 mg daily prn and discontinue if pregnancy is suspected. Continue following annually, before if needed.

## 2024-03-10 ENCOUNTER — Ambulatory Visit: Payer: Self-pay | Admitting: Family Medicine

## 2024-04-10 ENCOUNTER — Other Ambulatory Visit: Payer: Self-pay | Admitting: Medical Genetics

## 2024-04-26 ENCOUNTER — Encounter: Payer: Self-pay | Admitting: Family Medicine

## 2024-04-30 ENCOUNTER — Other Ambulatory Visit: Payer: Self-pay | Admitting: Family Medicine

## 2024-05-02 ENCOUNTER — Encounter (INDEPENDENT_AMBULATORY_CARE_PROVIDER_SITE_OTHER): Payer: Self-pay

## 2024-05-03 ENCOUNTER — Other Ambulatory Visit

## 2024-06-04 ENCOUNTER — Other Ambulatory Visit

## 2024-06-21 ENCOUNTER — Other Ambulatory Visit: Payer: Self-pay | Admitting: Family Medicine

## 2024-06-21 DIAGNOSIS — G43009 Migraine without aura, not intractable, without status migrainosus: Secondary | ICD-10-CM

## 2024-07-06 ENCOUNTER — Other Ambulatory Visit: Payer: Self-pay | Admitting: Medical Genetics

## 2024-07-06 DIAGNOSIS — Z006 Encounter for examination for normal comparison and control in clinical research program: Secondary | ICD-10-CM

## 2024-07-11 ENCOUNTER — Other Ambulatory Visit: Payer: Self-pay | Admitting: Medical Genetics
